# Patient Record
Sex: Female | Born: 1970 | Race: White | Hispanic: No | Smoking: Never smoker
Health system: Southern US, Community
[De-identification: ages and names within clinical notes are randomized; demographics above are authoritative.]

## PROBLEM LIST (undated history)

## (undated) DIAGNOSIS — G43829 Menstrual migraine, not intractable, without status migrainosus: Secondary | ICD-10-CM

## (undated) DIAGNOSIS — T7840XA Allergy, unspecified, initial encounter: Secondary | ICD-10-CM

## (undated) DIAGNOSIS — M1990 Unspecified osteoarthritis, unspecified site: Secondary | ICD-10-CM

## (undated) DIAGNOSIS — I10 Essential (primary) hypertension: Secondary | ICD-10-CM

## (undated) HISTORY — DX: Menstrual migraine, not intractable, without status migrainosus: G43.829

## (undated) HISTORY — PX: MOUTH SURGERY: SHX715

## (undated) HISTORY — DX: Essential (primary) hypertension: I10

## (undated) HISTORY — DX: Allergy, unspecified, initial encounter: T78.40XA

## (undated) HISTORY — DX: Unspecified osteoarthritis, unspecified site: M19.90

## (undated) HISTORY — PX: WISDOM TOOTH EXTRACTION: SHX21

---

## 2004-11-19 ENCOUNTER — Emergency Department: Payer: Self-pay | Admitting: Emergency Medicine

## 2007-08-03 ENCOUNTER — Ambulatory Visit: Payer: Self-pay

## 2007-08-06 ENCOUNTER — Ambulatory Visit: Payer: Self-pay

## 2008-03-01 ENCOUNTER — Ambulatory Visit: Payer: Self-pay | Admitting: Surgery

## 2008-09-07 ENCOUNTER — Ambulatory Visit: Payer: Self-pay

## 2010-09-13 ENCOUNTER — Ambulatory Visit: Payer: Self-pay

## 2011-09-15 ENCOUNTER — Ambulatory Visit: Payer: Self-pay

## 2012-09-22 ENCOUNTER — Ambulatory Visit: Payer: Self-pay

## 2013-11-01 ENCOUNTER — Ambulatory Visit: Payer: Self-pay

## 2014-09-07 LAB — HM PAP SMEAR: HM PAP: NEGATIVE

## 2014-09-07 LAB — HM MAMMOGRAPHY

## 2014-09-25 ENCOUNTER — Other Ambulatory Visit: Payer: Self-pay | Admitting: Obstetrics and Gynecology

## 2014-09-25 DIAGNOSIS — Z1231 Encounter for screening mammogram for malignant neoplasm of breast: Secondary | ICD-10-CM

## 2014-11-03 ENCOUNTER — Ambulatory Visit
Admission: RE | Admit: 2014-11-03 | Discharge: 2014-11-03 | Disposition: A | Discharge status: Home / Self Care | Payer: 59 | Source: Ambulatory Visit | Attending: Obstetrics and Gynecology | Admitting: Obstetrics and Gynecology

## 2014-11-03 DIAGNOSIS — Z1231 Encounter for screening mammogram for malignant neoplasm of breast: Secondary | ICD-10-CM | POA: Diagnosis not present

## 2015-10-17 ENCOUNTER — Other Ambulatory Visit: Payer: Self-pay | Admitting: Obstetrics and Gynecology

## 2015-10-17 DIAGNOSIS — Z1231 Encounter for screening mammogram for malignant neoplasm of breast: Secondary | ICD-10-CM

## 2015-11-06 ENCOUNTER — Ambulatory Visit: Payer: 59

## 2015-11-14 ENCOUNTER — Ambulatory Visit
Admission: RE | Admit: 2015-11-14 | Discharge: 2015-11-14 | Disposition: A | Discharge status: Home / Self Care | Payer: 59 | Source: Ambulatory Visit | Attending: Obstetrics and Gynecology | Admitting: Obstetrics and Gynecology

## 2015-11-14 ENCOUNTER — Other Ambulatory Visit: Payer: Self-pay | Admitting: Obstetrics and Gynecology

## 2015-11-14 DIAGNOSIS — Z1231 Encounter for screening mammogram for malignant neoplasm of breast: Secondary | ICD-10-CM | POA: Insufficient documentation

## 2016-07-28 ENCOUNTER — Telehealth: Payer: Self-pay | Admitting: Obstetrics and Gynecology

## 2016-07-28 NOTE — Telephone Encounter (Signed)
Pt would like to be notified when Prescription is sent. [phone]

## 2016-07-28 NOTE — Telephone Encounter (Signed)
Pt is calling to schedule annual and needs refill on her Birthcontrol sent to [facility]. Pt is schedule 09/11/16 with Helmut MusterAlicia.

## 2016-07-29 ENCOUNTER — Other Ambulatory Visit: Payer: Self-pay

## 2016-07-29 DIAGNOSIS — Z308 Encounter for other contraceptive management: Secondary | ICD-10-CM | POA: Insufficient documentation

## 2016-07-29 MED ORDER — NORETHINDRONE-ETH ESTRADIOL 1-35 MG-MCG PO TABS: 1.0000 | ORAL_TABLET | Freq: Every day | ORAL | 1 refills | Status: DC

## 2016-07-29 NOTE — Telephone Encounter (Signed)
Called to let pt know that refill has been sent in.  UTR pt as vm not set up.

## 2016-09-11 ENCOUNTER — Encounter: Payer: Self-pay | Admitting: Obstetrics and Gynecology

## 2016-09-11 ENCOUNTER — Ambulatory Visit (INDEPENDENT_AMBULATORY_CARE_PROVIDER_SITE_OTHER): Payer: 59 | Admitting: Obstetrics and Gynecology

## 2016-09-11 VITALS — BP 118/80 | Ht 61.0 in | Wt 209.0 lb

## 2016-09-11 DIAGNOSIS — Z01419 Encounter for gynecological examination (general) (routine) without abnormal findings: Secondary | ICD-10-CM

## 2016-09-11 DIAGNOSIS — Z124 Encounter for screening for malignant neoplasm of cervix: Secondary | ICD-10-CM | POA: Diagnosis not present

## 2016-09-11 DIAGNOSIS — Z3041 Encounter for surveillance of contraceptive pills: Secondary | ICD-10-CM | POA: Diagnosis not present

## 2016-09-11 DIAGNOSIS — Z1239 Encounter for other screening for malignant neoplasm of breast: Secondary | ICD-10-CM

## 2016-09-11 DIAGNOSIS — Z1231 Encounter for screening mammogram for malignant neoplasm of breast: Secondary | ICD-10-CM

## 2016-09-11 MED ORDER — LEVONORGESTREL-ETHINYL ESTRAD 0.15-30 MG-MCG PO TABS: 1.0000 | ORAL_TABLET | Freq: Every day | ORAL | 3 refills | Status: DC

## 2016-09-11 NOTE — Progress Notes (Signed)
Chief Complaint  Patient presents with  . Gynecologic Exam     HPI:      [name] is a 46 y.o. G1P1001 who LMP was Patient's last menstrual period was 08/20/2016., presents today for her annual examination.  Her menses are regular every 28-30 days, lasting 4 days.  Dysmenorrhea none. She does not have intermenstrual bleeding.  Sex activity: not sexually active.  Last Pap: September 07, 2014  Results were: no abnormalities /neg HPV DNA. Plain pap neg 09/10/15. Pt likes yearly paps.  Hx of STDs: none  Last mammogram: November 14, 2015  Results were: normal--routine follow-up in 12 months There is a FH of breast cancer in her MGM and mat grt aunt, genetic testing not indicated. There is no FH of ovarian cancer. The patient does do self-breast exams.  Tobacco use: The patient denies current or previous tobacco use. Alcohol use: social drinker Exercise: moderately active  She does get adequate calcium and Vitamin D in her diet.    Past Medical History:  Diagnosis Date  . Menstrual migraine     Past Surgical History:  Procedure Laterality Date  . MOUTH SURGERY      Family History  Problem Relation Age of Onset  . Breast cancer Maternal Grandmother 60  . Colon cancer Maternal Grandmother 27  . Liver cancer Maternal Grandmother   . Hypertension Mother   . Diabetes Mother   . Hypertension Father   . Diabetes Father   . Heart disease Father     Social History   Social History  . Marital status: Divorced    Spouse name: N/A  . Number of children: N/A  . Years of education: N/A   Occupational History  . Not on file.   Social History Main Topics  . Smoking status: Never Smoker  . Smokeless tobacco: Never Used  . Alcohol use No  . Drug use: No  . Sexual activity: No   Other Topics Concern  . Not on file   Social History Narrative  . No narrative on file     Current Outpatient Prescriptions:  .  levonorgestrel-ethinyl estradiol (NORDETTE) 0.15-30 MG-MCG  tablet, Take 1 tablet by mouth daily., Disp: 3 Package, Rfl: 3  ROS:  Review of Systems  Constitutional: Negative for fever, malaise/fatigue and weight loss.  HENT: Negative for congestion, ear pain and sinus pain.   Respiratory: Negative for cough, shortness of breath and wheezing.   Cardiovascular: Negative for chest pain, orthopnea and leg swelling.  Gastrointestinal: Negative for constipation, diarrhea, nausea and vomiting.  Genitourinary: Negative for dysuria, frequency, hematuria and urgency.       Breast ROS: negative   Musculoskeletal: Negative for back pain, joint pain and myalgias.  Skin: Negative for itching and rash.  Neurological: Negative for dizziness, tingling, focal weakness and headaches.  Endo/Heme/Allergies: Positive for environmental allergies. Does not bruise/bleed easily.  Psychiatric/Behavioral: Negative for depression and suicidal ideas. The patient is not nervous/anxious and does not have insomnia.     Objective: BP 118/80   Ht 5\' 1"  (1.549 m)   Wt 209 lb (94.8 kg)   LMP 08/20/2016   BMI 39.49 kg/m    Physical Exam  Constitutional: She is oriented to person, place, and time. She appears well-developed and well-nourished.  Genitourinary: Vagina normal and uterus normal. There is no rash or tenderness on the right labia. There is no rash or tenderness on the left labia. No erythema or tenderness in the vagina. No vaginal  discharge found. Right adnexum does not display mass and does not display tenderness. Left adnexum does not display mass and does not display tenderness. Cervix does not exhibit motion tenderness or polyp. Uterus is not enlarged or tender.  Neck: Normal range of motion. No thyromegaly present.  Cardiovascular: Normal rate, regular rhythm and normal heart sounds.   No murmur heard. Pulmonary/Chest: Effort normal and breath sounds normal. Right breast exhibits no mass, no nipple discharge, no skin change and no tenderness. Left breast exhibits  no mass, no nipple discharge, no skin change and no tenderness.  Abdominal: Soft. There is no tenderness. There is no guarding.  Musculoskeletal: Normal range of motion.  Neurological: She is alert and oriented to person, place, and time. No cranial nerve deficit.  Psychiatric: She has a normal mood and affect. Her behavior is normal.  Vitals reviewed.   Assessment/Plan: Encounter for annual routine gynecological examination  Cervical cancer screening - Pt likes yearly paps. HPV DNA due 2019. - Plan: Pap IG (Image Guided)  Encounter for surveillance of contraceptive pills - OCP RF to [facility].  - Plan: levonorgestrel-ethinyl estradiol (NORDETTE) 0.15-30 MG-MCG tablet  Screening for breast cancer - Pt to call to sched. - Plan: MM DIGITAL SCREENING BILATERAL             GYN counsel mammography screening, adequate intake of calcium and vitamin D, diet and exercise     F/U  Return in about 1 year (around 09/11/2017).  Boluwatife Mutchler B. Blayne Garlick, PA-C 09/11/2016 8:41 AM

## 2016-09-13 LAB — PAP IG (IMAGE GUIDED): PAP Smear Comment: 0

## 2016-09-15 ENCOUNTER — Telehealth: Payer: Self-pay

## 2016-09-16 NOTE — Telephone Encounter (Signed)
Pt needed us to contact [facility] to verify provider information. Called and gave superving NPI for ABC. Pt aware and appreciative.

## 2016-11-20 ENCOUNTER — Ambulatory Visit
Admission: RE | Admit: 2016-11-20 | Discharge: 2016-11-20 | Disposition: A | Discharge status: Home / Self Care | Payer: 59 | Source: Ambulatory Visit | Attending: Obstetrics and Gynecology | Admitting: Obstetrics and Gynecology

## 2016-11-20 DIAGNOSIS — Z1231 Encounter for screening mammogram for malignant neoplasm of breast: Secondary | ICD-10-CM | POA: Diagnosis not present

## 2016-11-20 DIAGNOSIS — Z1239 Encounter for other screening for malignant neoplasm of breast: Secondary | ICD-10-CM

## 2016-11-21 ENCOUNTER — Encounter: Payer: Self-pay | Admitting: Obstetrics and Gynecology

## 2017-07-17 ENCOUNTER — Other Ambulatory Visit: Payer: Self-pay | Admitting: Obstetrics and Gynecology

## 2017-07-17 DIAGNOSIS — Z3041 Encounter for surveillance of contraceptive pills: Secondary | ICD-10-CM

## 2017-07-20 ENCOUNTER — Other Ambulatory Visit: Payer: Self-pay | Admitting: Obstetrics and Gynecology

## 2017-07-20 ENCOUNTER — Encounter: Payer: Self-pay | Admitting: Obstetrics and Gynecology

## 2017-07-20 DIAGNOSIS — Z3041 Encounter for surveillance of contraceptive pills: Secondary | ICD-10-CM

## 2017-07-20 MED ORDER — LEVONORGESTREL-ETHINYL ESTRAD 0.15-30 MG-MCG PO TABS: 1.0000 | ORAL_TABLET | Freq: Every day | ORAL | 0 refills | Status: DC

## 2017-07-20 NOTE — Progress Notes (Signed)
Rx RF till annual 

## 2017-09-23 ENCOUNTER — Other Ambulatory Visit: Payer: Self-pay | Admitting: Obstetrics and Gynecology

## 2017-09-23 DIAGNOSIS — Z3041 Encounter for surveillance of contraceptive pills: Secondary | ICD-10-CM

## 2017-09-28 ENCOUNTER — Other Ambulatory Visit: Payer: Self-pay

## 2017-09-28 ENCOUNTER — Encounter: Payer: Self-pay | Admitting: Obstetrics and Gynecology

## 2017-09-28 ENCOUNTER — Ambulatory Visit (INDEPENDENT_AMBULATORY_CARE_PROVIDER_SITE_OTHER): Payer: Managed Care, Other (non HMO) | Admitting: Obstetrics and Gynecology

## 2017-09-28 VITALS — BP 140/90 | HR 81 | Ht 61.5 in | Wt 214.0 lb

## 2017-09-28 DIAGNOSIS — R05 Cough: Secondary | ICD-10-CM

## 2017-09-28 DIAGNOSIS — Z1239 Encounter for other screening for malignant neoplasm of breast: Secondary | ICD-10-CM

## 2017-09-28 DIAGNOSIS — R030 Elevated blood-pressure reading, without diagnosis of hypertension: Secondary | ICD-10-CM

## 2017-09-28 DIAGNOSIS — Z01419 Encounter for gynecological examination (general) (routine) without abnormal findings: Secondary | ICD-10-CM | POA: Diagnosis not present

## 2017-09-28 DIAGNOSIS — Z3041 Encounter for surveillance of contraceptive pills: Secondary | ICD-10-CM | POA: Diagnosis not present

## 2017-09-28 DIAGNOSIS — R059 Cough, unspecified: Secondary | ICD-10-CM

## 2017-09-28 DIAGNOSIS — Z124 Encounter for screening for malignant neoplasm of cervix: Secondary | ICD-10-CM

## 2017-09-28 DIAGNOSIS — Z1151 Encounter for screening for human papillomavirus (HPV): Secondary | ICD-10-CM | POA: Diagnosis not present

## 2017-09-28 DIAGNOSIS — Z1231 Encounter for screening mammogram for malignant neoplasm of breast: Secondary | ICD-10-CM | POA: Diagnosis not present

## 2017-09-28 MED ORDER — LEVONORGESTREL-ETHINYL ESTRAD 0.15-30 MG-MCG PO TABS: 1.0000 | ORAL_TABLET | Freq: Every day | ORAL | 2 refills | Status: DC

## 2017-09-28 NOTE — Progress Notes (Signed)
Chief Complaint  Patient presents with  . Gynecologic Exam    No complaints     HPI:      [name] is a 47 y.o. G1P1001 who LMP was Patient's last menstrual period was 09/16/2017., presents today for her annual examination. Her menses are regular every 28-30 days, lasting 3-4 days. Dysmenorrhea none. She does not have intermenstrual bleeding. Pt on OCPs for cycle control.   Sex activity: not sexually active.  Last Pap: 09/11/16  Results were: no abnormalities /neg HPV DNA 2016.  Pt likes yearly paps.  Hx of STDs: none  Last mammogram: 11/20/16  Results were: normal--routine follow-up in 12 months There is a FH of breast cancer in her MGM and mat grt aunt, genetic testing not indicated. There is no FH of ovarian cancer. The patient does do self-breast exams.  Tobacco use: The patient denies current or previous tobacco use. Alcohol use: social drinker Exercise: moderately active  She does get adequate calcium and Vitamin D in her diet.  Labs through work at [employer]  Pt has had dry cough for the past month, increased with exercise and laughing. Uses flonase. Tried claritin without relief. OTC allergy-cough meds and delsym help with sx. Sx slightly improved now. Hx of GERD recently and pt was taking OTC med.    Past Medical History:  Diagnosis Date  . Menstrual migraine     Past Surgical History:  Procedure Laterality Date  . MOUTH SURGERY      Family History  Problem Relation Age of Onset  . Breast cancer Maternal Grandmother 60  . Colon cancer Maternal Grandmother 50  . Liver cancer Maternal Grandmother   . Hypertension Mother   . Diabetes Mother   . Hypertension Father   . Diabetes Father   . Heart disease Father   . Hypertension Sister     Social History   Socioeconomic History  . Marital status: Divorced    Spouse name: Not on file  . Number of children: Not on file  . Years of education: Not on file  . Highest education level: Not on file    Occupational History  . Not on file  Social Needs  . Financial resource strain: Not on file  . Food insecurity:    Worry: Not on file    Inability: Not on file  . Transportation needs:    Medical: Not on file    Non-medical: Not on file  Tobacco Use  . Smoking status: Never Smoker  . Smokeless tobacco: Never Used  Substance and Sexual Activity  . Alcohol use: No  . Drug use: No  . Sexual activity: Never    Birth control/protection: Pill  Lifestyle  . Physical activity:    Days per week: 3 days    Minutes per session: 120 min  . Stress: Not on file  Relationships  . Social connections:    Talks on phone: Not on file    Gets together: Not on file    Attends religious service: Not on file    Active member of club or organization: Not on file    Attends meetings of clubs or organizations: Not on file    Relationship status: Not on file  . Intimate partner violence:    Fear of current or ex partner: Not on file    Emotionally abused: Not on file    Physically abused: Not on file    Forced sexual activity: Not on file  Other  Topics Concern  . Not on file  Social History Narrative  . Not on file    No current outpatient medications on file prior to visit.   No current facility-administered medications on file prior to visit.     ROS:  Review of Systems  Constitutional: Negative for fatigue, fever and unexpected weight change.  Respiratory: Positive for cough. Negative for shortness of breath and wheezing.   Cardiovascular: Negative for chest pain, palpitations and leg swelling.  Gastrointestinal: Negative for blood in stool, constipation, diarrhea, nausea and vomiting.  Endocrine: Negative for cold intolerance, heat intolerance and polyuria.  Genitourinary: Negative for dyspareunia, dysuria, flank pain, frequency, genital sores, hematuria, menstrual problem, pelvic pain, urgency, vaginal bleeding, vaginal discharge and vaginal pain.  Musculoskeletal: Negative for  back pain, joint swelling and myalgias.  Skin: Negative for rash.  Neurological: Negative for dizziness, syncope, light-headedness, numbness and headaches.  Hematological: Negative for adenopathy.  Psychiatric/Behavioral: Negative for agitation, confusion, sleep disturbance and suicidal ideas. The patient is not nervous/anxious.      Objective: BP 140/90 (BP Location: Left Arm, Patient Position: Sitting, Cuff Size: Large)   Pulse 81   Ht 5' 1.5" (1.562 m)   Wt 214 lb (97.1 kg)   LMP 09/16/2017   BMI 39.78 kg/m    Physical Exam  Constitutional: She is oriented to person, place, and time. She appears well-developed and well-nourished.  Genitourinary: Vagina normal and uterus normal. There is no rash or tenderness on the right labia. There is no rash or tenderness on the left labia. No erythema or tenderness in the vagina. No vaginal discharge found. Right adnexum does not display mass and does not display tenderness. Left adnexum does not display mass and does not display tenderness. Cervix does not exhibit motion tenderness or polyp. Uterus is not enlarged or tender.  Neck: Normal range of motion. No thyromegaly present.  Cardiovascular: Normal rate, regular rhythm and normal heart sounds.  No murmur heard. Pulmonary/Chest: Effort normal and breath sounds normal. Right breast exhibits no mass, no nipple discharge, no skin change and no tenderness. Left breast exhibits no mass, no nipple discharge, no skin change and no tenderness.  Abdominal: Soft. There is no tenderness. There is no guarding.  Musculoskeletal: Normal range of motion.  Neurological: She is alert and oriented to person, place, and time. No cranial nerve deficit.  Psychiatric: She has a normal mood and affect. Her behavior is normal.  Vitals reviewed.   Assessment/Plan: Encounter for annual routine gynecological examination  Cervical cancer screening - Plan: IGP, Aptima HPV  Screening for HPV (human papillomavirus)  - Plan: IGP, Aptima HPV  Screening for breast cancer - pt to sched mammo. - Plan: MM DIGITAL SCREENING BILATERAL  Encounter for surveillance of contraceptive pills - OCP RF to [facility]. Already had #84 tabs sent 09/24/17. - Plan: levonorgestrel-ethinyl estradiol (PORTIA-28) 0.15-30 MG-MCG tablet  Elevated blood-pressure reading without diagnosis of hypertension - BP check [facility] at [facility].  Cough - For 1 mo, sx improving. Add zyrtec to flonase. F/u with PCP if sx persist given hx of GERD.  Meds ordered this encounter  Medications  . levonorgestrel-ethinyl estradiol (PORTIA-28) 0.15-30 MG-MCG tablet    Sig: Take 1 tablet by mouth daily.    Dispense:  84 tablet    Refill:  2    Order Specific Question:   Supervising Provider    Answer:   Nadara Mustard [[id number]]             GYN counsel  breast self exam, mammography screening, adequate intake of calcium and vitamin D, diet and exercise     F/U  Return in about 1 year (around 09/29/2018).  Alicia B. Copland, PA-C 09/28/2017 10:50 AM

## 2017-09-28 NOTE — Patient Instructions (Addendum)
I value your feedback and entrusting us with your care. If you get a [facility] patient survey, I would appreciate you taking the time to let us know about your experience today. Thank you!  [facility]: [phone]    

## 2017-10-01 LAB — IGP, APTIMA HPV
HPV APTIMA: NEGATIVE
PAP SMEAR COMMENT: 0

## 2017-11-26 ENCOUNTER — Ambulatory Visit
Admission: RE | Admit: 2017-11-26 | Discharge: 2017-11-26 | Disposition: A | Discharge status: Home / Self Care | Payer: Managed Care, Other (non HMO) | Source: Ambulatory Visit | Attending: Obstetrics and Gynecology | Admitting: Obstetrics and Gynecology

## 2017-11-26 DIAGNOSIS — Z1231 Encounter for screening mammogram for malignant neoplasm of breast: Secondary | ICD-10-CM | POA: Diagnosis not present

## 2017-11-26 DIAGNOSIS — Z1239 Encounter for other screening for malignant neoplasm of breast: Secondary | ICD-10-CM

## 2017-11-30 ENCOUNTER — Encounter: Payer: Self-pay | Admitting: Obstetrics and Gynecology

## 2017-12-22 ENCOUNTER — Ambulatory Visit (INDEPENDENT_AMBULATORY_CARE_PROVIDER_SITE_OTHER): Payer: Managed Care, Other (non HMO)

## 2017-12-22 ENCOUNTER — Ambulatory Visit: Payer: Managed Care, Other (non HMO) | Admitting: Podiatry

## 2017-12-22 ENCOUNTER — Encounter: Payer: Self-pay | Admitting: Podiatry

## 2017-12-22 VITALS — BP 161/92 | HR 88

## 2017-12-22 DIAGNOSIS — M722 Plantar fascial fibromatosis: Secondary | ICD-10-CM

## 2017-12-22 DIAGNOSIS — E669 Obesity, unspecified: Secondary | ICD-10-CM | POA: Insufficient documentation

## 2017-12-22 MED ORDER — METHYLPREDNISOLONE 4 MG PO TBPK: ORAL_TABLET | ORAL | 0 refills | Status: DC

## 2017-12-22 MED ORDER — MELOXICAM 15 MG PO TABS: 15.0000 mg | ORAL_TABLET | Freq: Every day | ORAL | 1 refills | Status: AC

## 2017-12-24 NOTE — Progress Notes (Signed)
   Subjective: 47 year old female presenting today as a new patient with a chief complaint of pain to the plantar aspect of the right heel that began 3-4 years ago. She states she was treated by a chiropractor with laser treatments 5 years ago which helped alleviate the pain for a while but it has since returned. Walking and standing for long periods of time increases the pain. She has not done anything for treatment. Patient is here for further evaluation and treatment.   Past Medical History:  Diagnosis Date  . Menstrual migraine      Objective: Physical Exam General: The patient is alert and oriented x3 in no acute distress.  Dermatology: Skin is warm, dry and supple bilateral lower extremities. Negative for open lesions or macerations bilateral.   Vascular: Dorsalis Pedis and Posterior Tibial pulses palpable bilateral.  Capillary fill time is immediate to all digits.  Neurological: Epicritic and protective threshold intact bilateral.   Musculoskeletal: Tenderness to palpation to the plantar aspect of the right heel along the plantar fascia. All other joints range of motion within normal limits bilateral. Strength 5/5 in all groups bilateral.   Radiographic exam: Normal osseous mineralization. Joint spaces preserved. No fracture/dislocation/boney destruction. No other soft tissue abnormalities or radiopaque foreign bodies.   Assessment: 1. Plantar fasciitis right 2. Pain in right foot  Plan of Care:  1. Patient evaluated. Xrays reviewed.   2. Declined injections.  3. Rx for Medrol Dose pack placed 4. Rx for Meloxicam ordered for patient. 4. Plantar fascial band(s) dispensed 5. Instructed patient regarding therapies and modalities at home to alleviate symptoms.  6. Continue treatment with chiropractor if she feels like it is helping.  7. Return to clinic in 4 weeks.     Felecia ShellingBrent M. Rashaan Wyles, DPM [facility]  Dr. Felecia ShellingBrent M. Starlin Steib, DPM    [address]                                         [place], [place] [address]                Office [phone]  Fax [phone]

## 2018-01-19 ENCOUNTER — Ambulatory Visit: Payer: Managed Care, Other (non HMO) | Admitting: Podiatry

## 2018-02-05 ENCOUNTER — Other Ambulatory Visit: Payer: Self-pay

## 2018-02-05 ENCOUNTER — Ambulatory Visit (INDEPENDENT_AMBULATORY_CARE_PROVIDER_SITE_OTHER): Payer: Managed Care, Other (non HMO) | Admitting: Nurse Practitioner

## 2018-02-05 ENCOUNTER — Encounter: Payer: Self-pay | Admitting: Nurse Practitioner

## 2018-02-05 VITALS — BP 166/81 | HR 102 | Temp 98.4°F | Ht 61.5 in | Wt 224.8 lb

## 2018-02-05 DIAGNOSIS — R635 Abnormal weight gain: Secondary | ICD-10-CM | POA: Diagnosis not present

## 2018-02-05 DIAGNOSIS — E782 Mixed hyperlipidemia: Secondary | ICD-10-CM

## 2018-02-05 DIAGNOSIS — I10 Essential (primary) hypertension: Secondary | ICD-10-CM | POA: Diagnosis not present

## 2018-02-05 DIAGNOSIS — Z7689 Persons encountering health services in other specified circumstances: Secondary | ICD-10-CM

## 2018-02-05 MED ORDER — LISINOPRIL 10 MG PO TABS: 10.0000 mg | ORAL_TABLET | Freq: Every day | ORAL | 1 refills | Status: DC

## 2018-02-05 NOTE — Patient Instructions (Addendum)
Tina Hurley,   Thank you for coming in to clinic today.  1. START lisinopril 10 mg once daily.   Some of the possible side effects are:  - angioedema: swelling of lips, mouth, and tongue.  If this rare side effect occurs, please go to ED. - cough: you could develop a dry, hacking cough caused by this medicine.  If it occurs, it will go away after stopping this medicine.  Call the clinic before stopping the medication. - kidney damage: we will monitor your labs when we start this medicine and at least one time per year.  If you do not have an change in kidney function when starting this medicine, it will provide kidney protection over time.  2. Keep a food log for about 14 days, then re-evaluate where you can make changes. - Increase vegetables.  Please schedule a follow-up appointment with Wilhelmina Mcardle, AGNP. Return in about 6 weeks (around 03/19/2018) for hypertension.  If you have any other questions or concerns, please feel free to call the clinic or send a message through MyChart. You may also schedule an earlier appointment if necessary.  You will receive a survey after today's visit either digitally by e-mail or paper by [employer]. Your experiences and feedback matter to [place].  Please respond so we know how we are doing as we provide care for you.   Wilhelmina Mcardle, DNP, AGNP-BC Adult Gerontology Nurse Practitioner [facility], [facility]

## 2018-02-05 NOTE — Progress Notes (Signed)
Subjective:    Patient ID: Tina Hurley, female    DOB: 03/10/1971, 47 y.o.   MRN: [id number]  Tina Hurley is a 47 y.o. female presenting on 02/05/2018 for Establish Care (elevated bp, weight gain )     HPI Establish Care New Provider Pt last seen by PCP many years ago.  Patient returns to primary care today because of abnormal biometric employer screening.  Biometric form reveals abnormalities for the following: Elevated BP Increased Cholesterol Obesity with Weight Gain  BP and cholesterol: Patient has been asymptomatic.  Notes some dietary indiscretions, but is also concerned there may be some other causes for weight gain.  Insomnia [employer] Night time sleep aid 25 mg on weeknights. Patient has difficulty with sleep onset.  Past Medical History:  Diagnosis Date  . Allergy   . Menstrual migraine    Past Surgical History:  Procedure Laterality Date  . MOUTH SURGERY    . WISDOM TOOTH EXTRACTION     Social History   Socioeconomic History  . Marital status: Divorced    Spouse name: Not on file  . Number of children: Not on file  . Years of education: Not on file  . Highest education level: Some college, no degree  Occupational History  . Not on file  Social Needs  . Financial resource strain: Not hard at all  . Food insecurity:    Worry: Never true    Inability: Never true  . Transportation needs:    Medical: No    Non-medical: No  Tobacco Use  . Smoking status: Never Smoker  . Smokeless tobacco: Never Used  Substance and Sexual Activity  . Alcohol use: No  . Drug use: No  . Sexual activity: Never    Birth control/protection: Pill  Lifestyle  . Physical activity:    Days per week: 3 days    Minutes per session: 120 min  . Stress: Only a little  Relationships  . Social connections:    Talks on phone: Twice a week    Gets together: Twice a week    Attends religious service: Never    Active member of club or organization: No    Attends  meetings of clubs or organizations: Never    Relationship status: Divorced  . Intimate partner violence:    Fear of current or ex partner: No    Emotionally abused: No    Physically abused: No    Forced sexual activity: No  Other Topics Concern  . Not on file  Social History Narrative  . Not on file   Family History  Problem Relation Age of Onset  . Breast cancer Maternal Grandmother 60  . Colon cancer Maternal Grandmother 6  . Liver cancer Maternal Grandmother   . Hypertension Mother   . Diabetes Mother   . Hypertension Father   . Diabetes Father   . Heart disease Father   . Heart attack Father   . Obesity Father   . Hypertension Sister   . Dementia Maternal Grandfather   . Heart attack Paternal Grandfather    Current Outpatient Medications on File Prior to Visit  Medication Sig  . b complex vitamins tablet Take 1 tablet by mouth daily.  Marland Kitchen levonorgestrel-ethinyl estradiol (PORTIA-28) 0.15-30 MG-MCG tablet Take 1 tablet by mouth daily.  . Multiple Vitamin (MULTIVITAMIN) tablet Take 1 tablet by mouth daily.   No current facility-administered medications on file prior to visit.     Review of Systems Per  HPI unless specifically indicated above      Objective:    BP (!) 166/81 (BP Location: Left Arm, Patient Position: Sitting, Cuff Size: Normal)   Pulse (!) 102   Temp 98.4 F (36.9 C) (Oral)   Ht 5' 1.5" (1.562 m)   Wt 224 lb 12.8 oz (102 kg)   BMI 41.79 kg/m   Wt Readings from Last 3 Encounters:  03/19/18 219 lb (99.3 kg)  02/05/18 224 lb 12.8 oz (102 kg)  09/28/17 214 lb (97.1 kg)    Physical Exam  Constitutional: She is oriented to person, place, and time. She appears well-developed and well-nourished. No distress.  HENT:  Head: Normocephalic and atraumatic.  Neurological: She is alert and oriented to person, place, and time.  Skin: Skin is warm and dry. Capillary refill takes less than 2 seconds.  Psychiatric: She has a normal mood and affect. Her  behavior is normal.  Vitals reviewed.   Results for orders placed or performed in visit on 02/05/18  TSH  Result Value Ref Range   TSH 2.470 0.450 - 4.500 uIU/mL  Lipid panel  Result Value Ref Range   Cholesterol, Total 171 100 - 199 mg/dL   Triglycerides 272 (H) 0 - 149 mg/dL   HDL 33 (L) >53 mg/dL   VLDL Cholesterol Cal 61 (H) 5 - 40 mg/dL   LDL Calculated 77 0 - 99 mg/dL   Chol/HDL Ratio 5.2 (H) 0.0 - 4.4 ratio  Comprehensive metabolic panel  Result Value Ref Range   Glucose 83 65 - 99 mg/dL   BUN 9 6 - 24 mg/dL   Creatinine, Ser 6.64 0.57 - 1.00 mg/dL   GFR calc non Af Amer 104 >59 mL/min/1.73   GFR calc Af Amer 120 >59 mL/min/1.73   BUN/Creatinine Ratio 13 9 - 23   Sodium 137 134 - 144 mmol/L   Potassium 4.0 3.5 - 5.2 mmol/L   Chloride 103 96 - 106 mmol/L   CO2 21 20 - 29 mmol/L   Calcium 9.4 8.7 - 10.2 mg/dL   Total Protein 6.6 6.0 - 8.5 g/dL   Albumin 4.0 3.5 - 5.5 g/dL   Globulin, Total 2.6 1.5 - 4.5 g/dL   Albumin/Globulin Ratio 1.5 1.2 - 2.2   Bilirubin Total 0.4 0.0 - 1.2 mg/dL   Alkaline Phosphatase 64 39 - 117 IU/L   AST 20 0 - 40 IU/L   ALT 20 0 - 32 IU/L      Assessment & Plan:   Problem List Items Addressed This Visit    None    Visit Diagnoses    Essential hypertension    -  Primary Uncontrolled hypertension.  BP goal < 130/80.  Pt is not working on lifestyle modifications.  Taking medications tolerating well without side effects.  Plan: 1. START lisinopril 10 mg once daily.  Discussed possible side effects of angioedema (rare), cough (common and reversible), kidney damage (rare, increase monitoring with start). 2. Obtain labs  3. Encouraged heart healthy diet and increasing exercise to 30 minutes most days of the week. 4. Check BP 1-2 x per week at home, keep log, and bring to clinic at next appointment. 5. Follow up 6 weeks.     Relevant Orders   TSH (Completed)   Lipid panel (Completed)   Comprehensive metabolic panel (Completed)    Encounter to establish care     Previous PCP was many years ago.  Records will not be requested.  Past medical, family, and surgical  history reviewed w/ patient in clinic.     Weight gain     Possible association to thyroid disorder.  Otherwise, is likely calorie overnutrition.  Labs today.  FOLLOW-UP 6 weeks.   Relevant Orders   TSH (Completed)   Mixed hyperlipidemia Stable without signs and symptoms of ASCVD events.  Repeat lipid panel.  Consider starting statin therapy if needed.  Diet changes recommended for weight loss.  See AVS.  Follow-up 6 weeks prn.       Relevant Orders   Lipid panel (Completed)   Comprehensive metabolic panel (Completed)      Meds ordered this encounter  Medications  . lisinopril (PRINIVIL,ZESTRIL) 10 MG tablet    Sig: Take 1 tablet (10 mg total) by mouth daily.    Dispense:  90 tablet    Refill:  1    Order Specific Question:   Supervising Provider    Answer:   Smitty Cords [[id number]]    Follow up plan: Return in about 6 weeks (around 03/19/2018) for hypertension.  Wilhelmina Mcardle, DNP, AGPCNP-BC Adult Gerontology Primary Care Nurse Practitioner [facility] 02/05/2018, 1:57 PM

## 2018-02-26 LAB — COMPREHENSIVE METABOLIC PANEL
ALT: 20 IU/L (ref 0–32)
AST: 20 IU/L (ref 0–40)
Albumin/Globulin Ratio: 1.5 (ref 1.2–2.2)
Albumin: 4 g/dL (ref 3.5–5.5)
Alkaline Phosphatase: 64 IU/L (ref 39–117)
BUN/Creatinine Ratio: 13 (ref 9–23)
BUN: 9 mg/dL (ref 6–24)
Bilirubin Total: 0.4 mg/dL (ref 0.0–1.2)
CO2: 21 mmol/L (ref 20–29)
Calcium: 9.4 mg/dL (ref 8.7–10.2)
Chloride: 103 mmol/L (ref 96–106)
Creatinine, Ser: 0.69 mg/dL (ref 0.57–1.00)
GFR calc Af Amer: 120 mL/min/{1.73_m2} (ref >59)
GFR calc non Af Amer: 104 mL/min/{1.73_m2} (ref >59)
Globulin, Total: 2.6 g/dL (ref 1.5–4.5)
Glucose: 83 mg/dL (ref 65–99)
Potassium: 4 mmol/L (ref 3.5–5.2)
Sodium: 137 mmol/L (ref 134–144)
Total Protein: 6.6 g/dL (ref 6.0–8.5)

## 2018-02-26 LAB — LIPID PANEL
Chol/HDL Ratio: 5.2 ratio — ABNORMAL HIGH (ref 0.0–4.4)
Cholesterol, Total: 171 mg/dL (ref 100–199)
HDL: 33 mg/dL — ABNORMAL LOW (ref >39)
LDL Calculated: 77 mg/dL (ref 0–99)
Triglycerides: 306 mg/dL — ABNORMAL HIGH (ref 0–149)
VLDL Cholesterol Cal: 61 mg/dL — ABNORMAL HIGH (ref 5–40)

## 2018-02-26 LAB — TSH: TSH: 2.47 u[IU]/mL (ref 0.450–4.500)

## 2018-03-11 ENCOUNTER — Encounter: Payer: Self-pay | Admitting: Obstetrics and Gynecology

## 2018-03-15 NOTE — Telephone Encounter (Signed)
This pt is getting a bill from [facility] for trichomonas testing done on routine STD testing with pap smear (applied to deductible). They aren't covering trich. Is there a way to contact [facility] to see if we can add additional codes for coverage?

## 2018-03-19 ENCOUNTER — Encounter: Payer: Self-pay | Admitting: Nurse Practitioner

## 2018-03-19 ENCOUNTER — Ambulatory Visit: Payer: Managed Care, Other (non HMO) | Admitting: Nurse Practitioner

## 2018-03-19 VITALS — BP 126/67 | HR 83 | Temp 98.2°F | Ht 62.0 in | Wt 219.0 lb

## 2018-03-19 DIAGNOSIS — I10 Essential (primary) hypertension: Secondary | ICD-10-CM

## 2018-03-19 DIAGNOSIS — E782 Mixed hyperlipidemia: Secondary | ICD-10-CM | POA: Diagnosis not present

## 2018-03-19 MED ORDER — LISINOPRIL 10 MG PO TABS: 10.0000 mg | ORAL_TABLET | Freq: Every day | ORAL | 1 refills | Status: DC

## 2018-03-19 NOTE — Progress Notes (Signed)
Subjective:    Patient ID: Tina Hurley, female    DOB: 06/18/1970, 47 y.o.   MRN: [id number]  Tina Hurley is a 47 y.o. female presenting on 03/19/2018 for BP Follow Up   HPI Hypertension - She is checking BP at home or outside of clinic.    - Current medications: lisinopril 10 mg once daily, tolerating well without side effects - She is not currently symptomatic. - Pt denies headache, lightheadedness, dizziness, changes in vision, chest tightness/pressure, palpitations, leg swelling, sudden loss of speech or loss of consciousness. - She  reports no regular exercise routine. - Her diet is moderate in salt, moderate in fat, and moderate in carbohydrates.  - Has done food log x 2 weeks and has made enough changes for 5 lbs weight loss.  Social History   Tobacco Use  . Smoking status: Never Smoker  . Smokeless tobacco: Never Used  Substance Use Topics  . Alcohol use: No  . Drug use: No    Review of Systems Per HPI unless specifically indicated above     Objective:    BP 126/67 (BP Location: Right Arm, Patient Position: Sitting, Cuff Size: Large)   Pulse 83   Temp 98.2 F (36.8 C)   Ht 5\' 2"  (1.575 m)   Wt 219 lb (99.3 kg)   SpO2 98%   BMI 40.06 kg/m   Wt Readings from Last 3 Encounters:  03/19/18 219 lb (99.3 kg)  02/05/18 224 lb 12.8 oz (102 kg)  09/28/17 214 lb (97.1 kg)    Physical Exam  Constitutional: She is oriented to person, place, and time. She appears well-developed and well-nourished. No distress.  HENT:  Head: Normocephalic and atraumatic.  Neck: Normal range of motion. Neck supple. Carotid bruit is not present.  Cardiovascular: Normal rate, regular rhythm, S1 normal, S2 normal, normal heart sounds and intact distal pulses.  Pulmonary/Chest: Effort normal and breath sounds normal. No respiratory distress.  Musculoskeletal: She exhibits no edema (pedal).  Neurological: She is alert and oriented to person, place, and time.  Skin: Skin is warm  and dry. Capillary refill takes less than 2 seconds.  Psychiatric: She has a normal mood and affect. Her behavior is normal. Judgment and thought content normal.  Vitals reviewed.  Results for orders placed or performed in visit on 02/05/18  TSH  Result Value Ref Range   TSH 2.470 0.450 - 4.500 uIU/mL  Lipid panel  Result Value Ref Range   Cholesterol, Total 171 100 - 199 mg/dL   Triglycerides 272 (H) 0 - 149 mg/dL   HDL 33 (L) >53 mg/dL   VLDL Cholesterol Cal 61 (H) 5 - 40 mg/dL   LDL Calculated 77 0 - 99 mg/dL   Chol/HDL Ratio 5.2 (H) 0.0 - 4.4 ratio  Comprehensive metabolic panel  Result Value Ref Range   Glucose 83 65 - 99 mg/dL   BUN 9 6 - 24 mg/dL   Creatinine, Ser 6.64 0.57 - 1.00 mg/dL   GFR calc non Af Amer 104 >59 mL/min/1.73   GFR calc Af Amer 120 >59 mL/min/1.73   BUN/Creatinine Ratio 13 9 - 23   Sodium 137 134 - 144 mmol/L   Potassium 4.0 3.5 - 5.2 mmol/L   Chloride 103 96 - 106 mmol/L   CO2 21 20 - 29 mmol/L   Calcium 9.4 8.7 - 10.2 mg/dL   Total Protein 6.6 6.0 - 8.5 g/dL   Albumin 4.0 3.5 - 5.5 g/dL   Globulin,  Total 2.6 1.5 - 4.5 g/dL   Albumin/Globulin Ratio 1.5 1.2 - 2.2   Bilirubin Total 0.4 0.0 - 1.2 mg/dL   Alkaline Phosphatase 64 39 - 117 IU/L   AST 20 0 - 40 IU/L   ALT 20 0 - 32 IU/L      Assessment & Plan:   Problem List Items Addressed This Visit    None    Visit Diagnoses    Essential hypertension    -  Primary Controlled hypertension.  BP goal < 130/80.  Pt is working on lifestyle modifications.  Taking medications tolerating well without side effects. No current complications.  Plan: 1. Continue taking lisinopril 10 mg once daily 2. Obtain labs again prior to next visit.  3. Encouraged heart healthy diet and increasing exercise to 30 minutes most days of the week. 4. Check BP 1-2 x per week at home, keep log, and bring to clinic at next appointment. 5. Follow up 6 months.     Relevant Medications   lisinopril (PRINIVIL,ZESTRIL) 10 MG  tablet   Other Relevant Orders   Comprehensive metabolic panel   Mixed hyperlipidemia     Will need lipid panel recheck prior to next visit.  Orders placed today.  Focus on lifestyle changes for further triglyceride reduction.  Continue fish oil 1,000 mg bid as recommended after last labs. - Follow-up 6 months.   Relevant Medications   lisinopril (PRINIVIL,ZESTRIL) 10 MG tablet   Other Relevant Orders   Lipid panel   Comprehensive metabolic panel      Meds ordered this encounter  Medications  . lisinopril (PRINIVIL,ZESTRIL) 10 MG tablet    Sig: Take 1 tablet (10 mg total) by mouth daily.    Dispense:  90 tablet    Refill:  1    Order Specific Question:   Supervising Provider    Answer:   Smitty Cords [[id number]]    Follow up plan: Return in about 6 months (around 09/17/2018) for hypertension.  Tina Mcardle, DNP, AGPCNP-BC Adult Gerontology Primary Care Nurse Practitioner [facility] 03/19/2018, 1:25 PM

## 2018-03-19 NOTE — Patient Instructions (Addendum)
Tina Hurley,   Thank you for coming in to clinic today.  1. Continue all of your great work on lifestyle.  2. Continue lisinopril 10 mg once daily.  3. You will be due for FASTING BLOOD WORK.  This means you should eat no food or drink after midnight.  Drink only water or coffee without cream/sugar on the morning of your lab visit. - Please go ahead and schedule a "Lab Only" visit in the morning at the clinic for lab draw 3 days before your next office visit.  Call in advance for labs to be released for [facility].  Around May 4th. - Your results will be available about 2-3 days after blood draw.  If you have set up a MyChart account, you can can log in to MyChart online to view your results and a brief explanation. Also, we can discuss your results together at your next office visit if you would like.  Please schedule a follow-up appointment with Wilhelmina Mcardle, AGNP. Return in about 6 months (around 09/17/2018) for hypertension.  If you have any other questions or concerns, please feel free to call the clinic or send a message through MyChart. You may also schedule an earlier appointment if necessary.  You will receive a survey after today's visit either digitally by e-mail or paper by [employer]. Your experiences and feedback matter to [place].  Please respond so we know how we are doing as we provide care for you.   Wilhelmina Mcardle, DNP, AGNP-BC Adult Gerontology Nurse Practitioner [facility], [facility]

## 2018-04-01 ENCOUNTER — Encounter: Payer: Self-pay | Admitting: Nurse Practitioner

## 2018-09-08 ENCOUNTER — Other Ambulatory Visit: Payer: Self-pay | Admitting: Family Medicine

## 2018-09-08 DIAGNOSIS — I10 Essential (primary) hypertension: Secondary | ICD-10-CM

## 2018-09-08 DIAGNOSIS — E782 Mixed hyperlipidemia: Secondary | ICD-10-CM

## 2018-09-11 LAB — LIPID PANEL
Chol/HDL Ratio: 4.5 ratio — ABNORMAL HIGH (ref 0.0–4.4)
Cholesterol, Total: 165 mg/dL (ref 100–199)
HDL: 37 mg/dL — ABNORMAL LOW (ref >39)
LDL Calculated: 85 mg/dL (ref 0–99)
Triglycerides: 216 mg/dL — ABNORMAL HIGH (ref 0–149)
VLDL Cholesterol Cal: 43 mg/dL — ABNORMAL HIGH (ref 5–40)

## 2018-09-11 LAB — COMPREHENSIVE METABOLIC PANEL
ALT: 13 IU/L (ref 0–32)
AST: 16 IU/L (ref 0–40)
Albumin/Globulin Ratio: 1.3 (ref 1.2–2.2)
Albumin: 3.7 g/dL — ABNORMAL LOW (ref 3.8–4.8)
Alkaline Phosphatase: 62 IU/L (ref 39–117)
BUN/Creatinine Ratio: 16 (ref 9–23)
BUN: 12 mg/dL (ref 6–24)
Bilirubin Total: 0.4 mg/dL (ref 0.0–1.2)
CO2: 18 mmol/L — ABNORMAL LOW (ref 20–29)
Calcium: 9.5 mg/dL (ref 8.7–10.2)
Chloride: 103 mmol/L (ref 96–106)
Creatinine, Ser: 0.74 mg/dL (ref 0.57–1.00)
GFR calc Af Amer: 111 mL/min/{1.73_m2} (ref >59)
GFR calc non Af Amer: 96 mL/min/{1.73_m2} (ref >59)
Globulin, Total: 2.8 g/dL (ref 1.5–4.5)
Glucose: 83 mg/dL (ref 65–99)
Potassium: 4.1 mmol/L (ref 3.5–5.2)
Sodium: 136 mmol/L (ref 134–144)
Total Protein: 6.5 g/dL (ref 6.0–8.5)

## 2018-09-24 ENCOUNTER — Encounter: Payer: Self-pay | Admitting: Family Medicine

## 2018-09-24 ENCOUNTER — Ambulatory Visit (INDEPENDENT_AMBULATORY_CARE_PROVIDER_SITE_OTHER): Payer: Managed Care, Other (non HMO) | Admitting: Family Medicine

## 2018-09-24 ENCOUNTER — Other Ambulatory Visit: Payer: Self-pay

## 2018-09-24 DIAGNOSIS — E782 Mixed hyperlipidemia: Secondary | ICD-10-CM

## 2018-09-24 DIAGNOSIS — I10 Essential (primary) hypertension: Secondary | ICD-10-CM | POA: Diagnosis not present

## 2018-09-24 NOTE — Assessment & Plan Note (Signed)
Improving cholesterol mostly hypertriglyceridemia on improving lifestyle Last lipid panel 09/2018  Plan: 1. START OTC FIsh Oil Omega 3 - 1,000 mg daily - already has - not started yet 2. Encourage improved lifestyle - low carb/cholesterol, reduce portion size, continue improving  regular exercise  Can do yearly lipids at this point if preferred

## 2018-09-24 NOTE — Assessment & Plan Note (Signed)
Reportedly controlled BP still, improving lifestyle - Home BP readings none  No known complications     Plan:  1. Continue current BP regimen - Lisinopril 10mg  daily 2. Encourage improved lifestyle - low sodium diet, regular exercise 3. Future can resume monitor BP outside office, bring readings to next visit, if persistently >140/90 or new symptoms notify office sooner 4. Follow-up 6 months

## 2018-09-24 NOTE — Progress Notes (Signed)
Virtual Visit via Telephone The purpose of this virtual visit is to provide medical care while limiting exposure to the novel coronavirus (COVID19) for both patient and office staff.  Consent was obtained for phone visit:  Yes.   Answered questions that patient had about telehealth interaction:  Yes.   I discussed the limitations, risks, security and privacy concerns of performing an evaluation and management service by telephone. I also discussed with the patient that there may be a patient responsible charge related to this service. The patient expressed understanding and agreed to proceed.  Patient Location: Home Provider Location: [facility] (Office)  PCP is Wilhelmina Mcardle, AGPCNP-BC - I am currently covering during her maternity leave.   ---------------------------------------------------------------------- Chief Complaint  Patient presents with  . Hypertension    S: Reviewed CMA documentation. I have called patient and gathered additional HPI as follows:  CHRONIC HTN: Reports not checking home BP currently. Not going to pharmacy to check BP. Current Meds - Lisinopril 10mg  daily   Reports good compliance, took meds today. Tolerating well, w/o complaints. Denies CP, dyspnea, HA, edema, dizziness / lightheadedness  HYPERLIPIDEMIA: - Reports no concerns. Last lipid panel 09/2018, improved Triglycerides from 300 down to 200, normal LDL and improving HDL - Currently not on cholesterol medicine. Has Fish Oil omega 3 1,000mg  Lifestyle - Weight unchanged, hopes to try to lose weight to reduce her BP med - Diet: Now working from home, less snacking on cheese as she did at work - Exercise: working x 4 weekly at home    Patient is currently working from home Denies any high risk travel to areas of current concern for COVID19. Denies any known or suspected exposure to person with or possibly with COVID19.  Denies any fevers, chills, sweats, body ache, cough, shortness  of breath, sinus pain or pressure, headache, abdominal pain, diarrhea  Past Medical History:  Diagnosis Date  . Allergy   . Menstrual migraine    Social History   Tobacco Use  . Smoking status: Never Smoker  . Smokeless tobacco: Never Used  Substance Use Topics  . Alcohol use: Yes    Comment: occassionally   . Drug use: No    Current Outpatient Medications:  .  levonorgestrel-ethinyl estradiol (PORTIA-28) 0.15-30 MG-MCG tablet, Take 1 tablet by mouth daily., Disp: 84 tablet, Rfl: 2 .  lisinopril (PRINIVIL,ZESTRIL) 10 MG tablet, Take 1 tablet (10 mg total) by mouth daily., Disp: 90 tablet, Rfl: 1 .  loratadine (CLARITIN) 10 MG tablet, Take 10 mg by mouth daily., Disp: , Rfl:  .  Multiple Vitamin (MULTIVITAMIN) tablet, Take 1 tablet by mouth daily., Disp: , Rfl:  .  b complex vitamins tablet, Take 1 tablet by mouth daily., Disp: , Rfl:   Depression screen PHQ 2/9 02/05/2018  Decreased Interest 0  Down, Depressed, Hopeless 0  PHQ - 2 Score 0    No flowsheet data found.  -------------------------------------------------------------------------- O: No physical exam performed due to remote telephone encounter.  Lab results reviewed.  Recent Results (from the past 2160 hour(s))  Comprehensive metabolic panel     Status: Abnormal   Collection Time: 09/10/18  9:00 AM  Result Value Ref Range   Glucose 83 65 - 99 mg/dL   BUN 12 6 - 24 mg/dL   Creatinine, Ser 8.11 0.57 - 1.00 mg/dL   GFR calc non Af Amer 96 >59 mL/min/1.73   GFR calc Af Amer 111 >59 mL/min/1.73   BUN/Creatinine Ratio 16 9 - 23  Sodium 136 134 - 144 mmol/L   Potassium 4.1 3.5 - 5.2 mmol/L   Chloride 103 96 - 106 mmol/L   CO2 18 (L) 20 - 29 mmol/L   Calcium 9.5 8.7 - 10.2 mg/dL   Total Protein 6.5 6.0 - 8.5 g/dL   Albumin 3.7 (L) 3.8 - 4.8 g/dL   Globulin, Total 2.8 1.5 - 4.5 g/dL   Albumin/Globulin Ratio 1.3 1.2 - 2.2   Bilirubin Total 0.4 0.0 - 1.2 mg/dL   Alkaline Phosphatase 62 39 - 117 IU/L   AST 16  0 - 40 IU/L   ALT 13 0 - 32 IU/L  Lipid panel     Status: Abnormal   Collection Time: 09/10/18  9:01 AM  Result Value Ref Range   Cholesterol, Total 165 100 - 199 mg/dL   Triglycerides 469216 (H) 0 - 149 mg/dL   HDL 37 (L) >62>39 mg/dL   VLDL Cholesterol Cal 43 (H) 5 - 40 mg/dL   LDL Calculated 85 0 - 99 mg/dL   Chol/HDL Ratio 4.5 (H) 0.0 - 4.4 ratio    Comment:                                   T. Chol/HDL Ratio                                             Men  Women                               1/2 Avg.Risk  3.4    3.3                                   Avg.Risk  5.0    4.4                                2X Avg.Risk  9.6    7.1                                3X Avg.Risk 23.4   11.0     -------------------------------------------------------------------------- A&P:  Problem List Items Addressed This Visit    Essential hypertension - Primary    Reportedly controlled BP still, improving lifestyle - Home BP readings none  No known complications     Plan:  1. Continue current BP regimen - Lisinopril 10mg  daily 2. Encourage improved lifestyle - low sodium diet, regular exercise 3. Future can resume monitor BP outside office, bring readings to next visit, if persistently >140/90 or new symptoms notify office sooner 4. Follow-up 6 months       Mixed hyperlipidemia    Improving cholesterol mostly hypertriglyceridemia on improving lifestyle Last lipid panel 09/2018  Plan: 1. START OTC FIsh Oil Omega 3 - 1,000 mg daily - already has - not started yet 2. Encourage improved lifestyle - low carb/cholesterol, reduce portion size, continue improving  regular exercise  Can do yearly lipids at this point if preferred         No orders of the defined types were  placed in this encounter.   Follow-up: - Return in 6 months for HTN, HLD, Weight w/ PCP  - Works for [employer] will have her yearly biometric in the future  Patient verbalizes understanding with the above medical  recommendations including the limitation of remote medical advice.  Specific follow-up and call-back criteria were given for patient to follow-up or seek medical care more urgently if needed.   - Time spent in direct consultation with patient on phone: 15 minutes   Saralyn Pilar, DO [facility] 09/24/2018, 11:20 AM

## 2018-09-24 NOTE — Patient Instructions (Addendum)
Keep up good work overall!   Continue with exercise regimen.  Cholesterol is improving  You can add Fish Oil Omega 3 - 1,000 per pill once daily with meal, if want can take 1 twice a day.   Please schedule a Follow-up Appointment to: Return in about 6 months (around 03/27/2019) for HTN, HLD, Weight.  If you have any other questions or concerns, please feel free to call the office or send a message through MyChart. You may also schedule an earlier appointment if necessary.  Additionally, you may be receiving a survey about your experience at our office within a few days to 1 week by e-mail or mail. We value your feedback.  Saralyn Pilar, DO [facility], [place]

## 2018-09-29 ENCOUNTER — Other Ambulatory Visit: Payer: Self-pay | Admitting: Nurse Practitioner

## 2018-09-29 DIAGNOSIS — I10 Essential (primary) hypertension: Secondary | ICD-10-CM

## 2018-10-14 ENCOUNTER — Ambulatory Visit (INDEPENDENT_AMBULATORY_CARE_PROVIDER_SITE_OTHER): Payer: Managed Care, Other (non HMO) | Admitting: Obstetrics and Gynecology

## 2018-10-14 ENCOUNTER — Encounter: Payer: Self-pay | Admitting: Obstetrics and Gynecology

## 2018-10-14 ENCOUNTER — Other Ambulatory Visit: Payer: Self-pay

## 2018-10-14 VITALS — BP 130/80 | Ht 61.5 in | Wt 223.6 lb

## 2018-10-14 DIAGNOSIS — Z3041 Encounter for surveillance of contraceptive pills: Secondary | ICD-10-CM

## 2018-10-14 DIAGNOSIS — Z1239 Encounter for other screening for malignant neoplasm of breast: Secondary | ICD-10-CM

## 2018-10-14 DIAGNOSIS — Z124 Encounter for screening for malignant neoplasm of cervix: Secondary | ICD-10-CM

## 2018-10-14 DIAGNOSIS — Z01419 Encounter for gynecological examination (general) (routine) without abnormal findings: Secondary | ICD-10-CM | POA: Diagnosis not present

## 2018-10-14 MED ORDER — NORETHINDRONE 0.35 MG PO TABS: 1.0000 | ORAL_TABLET | Freq: Every day | ORAL | 3 refills | Status: DC

## 2018-10-14 NOTE — Progress Notes (Signed)
Chief Complaint  Patient presents with  . Gynecologic Exam  . [facility] Employee     HPI:      Tina Hurley is a 48 y.o. G1P1001 who LMP was Patient's last menstrual period was 10/05/2018 (approximate)., presents today for her annual examination. Her menses are regular every 1-2 months with cont dosing OCPs, lasting 2-5days. Dysmenorrhea none. She does not have intermenstrual bleeding. Pt on OCPs for cycle control. Has new dx of HTN and is on lisinopril.   Sex activity: not sexually active.  Last Pap: 09/28/17  Results were: no abnormalities /neg HPV DNA.  Pt likes yearly paps.  Hx of STDs: none  Last mammogram: 11/26/17 Results were: normal--routine follow-up in 12 months There is a FH of breast cancer in her MGM and mat grt aunt, genetic testing not indicated. There is no FH of ovarian cancer. The patient does do self-breast exams.  Tobacco use: The patient denies current or previous tobacco use. Alcohol use: social drinker Exercise: moderately active  She does get adequate calcium and Vitamin D in her diet.  Labs with PCP/work.   Past Medical History:  Diagnosis Date  . Allergy   . Hypertension   . Menstrual migraine     Past Surgical History:  Procedure Laterality Date  . MOUTH SURGERY    . WISDOM TOOTH EXTRACTION      Family History  Problem Relation Age of Onset  . Breast cancer Maternal Grandmother 60  . Colon cancer Maternal Grandmother 462  . Liver cancer Maternal Grandmother   . Hypertension Mother   . Diabetes Mother   . Hypertension Father   . Diabetes Father   . Heart disease Father   . Heart attack Father   . Obesity Father   . Hypertension Sister   . Dementia Maternal Grandfather   . Heart attack Paternal Grandfather     Social History   Socioeconomic History  . Marital status: Divorced    Spouse name: Not on file  . Number of children: Not on file  . Years of education: Not on file  . Highest education level: Some college, no  degree  Occupational History  . Not on file  Social Needs  . Financial resource strain: Not hard at all  . Food insecurity:    Worry: Never true    Inability: Never true  . Transportation needs:    Medical: No    Non-medical: No  Tobacco Use  . Smoking status: Never Smoker  . Smokeless tobacco: Never Used  Substance and Sexual Activity  . Alcohol use: Yes    Comment: occassionally   . Drug use: No  . Sexual activity: Not Currently    Birth control/protection: Pill  Lifestyle  . Physical activity:    Days per week: 3 days    Minutes per session: 120 min  . Stress: Only a little  Relationships  . Social connections:    Talks on phone: Twice a week    Gets together: Twice a week    Attends religious service: Never    Active member of club or organization: No    Attends meetings of clubs or organizations: Never    Relationship status: Divorced  . Intimate partner violence:    Fear of current or ex partner: No    Emotionally abused: No    Physically abused: No    Forced sexual activity: No  Other Topics Concern  . Not on file  Social History Narrative  .  Not on file    Current Outpatient Medications on File Prior to Visit  Medication Sig Dispense Refill  . lisinopril (ZESTRIL) 10 MG tablet TAKE 1 TABLET BY MOUTH  DAILY 90 tablet 1  . loratadine (CLARITIN) 10 MG tablet Take 10 mg by mouth daily.    . Multiple Vitamin (MULTIVITAMIN) tablet Take 1 tablet by mouth daily.    Marland Kitchen b complex vitamins tablet Take 1 tablet by mouth daily.     No current facility-administered medications on file prior to visit.     ROS:  Review of Systems  Constitutional: Negative for fatigue, fever and unexpected weight change.  Respiratory: Negative for cough, shortness of breath and wheezing.   Cardiovascular: Negative for chest pain, palpitations and leg swelling.  Gastrointestinal: Negative for blood in stool, constipation, diarrhea, nausea and vomiting.  Endocrine: Negative for cold  intolerance, heat intolerance and polyuria.  Genitourinary: Negative for dyspareunia, dysuria, flank pain, frequency, genital sores, hematuria, menstrual problem, pelvic pain, urgency, vaginal bleeding, vaginal discharge and vaginal pain.  Musculoskeletal: Negative for back pain, joint swelling and myalgias.  Skin: Negative for rash.  Neurological: Negative for dizziness, syncope, light-headedness, numbness and headaches.  Hematological: Negative for adenopathy.  Psychiatric/Behavioral: Negative for agitation, confusion, sleep disturbance and suicidal ideas. The patient is not nervous/anxious.      Objective: BP 130/80   Ht 5' 1.5" (1.562 m)   Wt 223 lb 9.6 oz (101.4 kg)   LMP 10/05/2018 (Approximate)   BMI 41.56 kg/m    Physical Exam Constitutional:      Appearance: She is well-developed.  Genitourinary:     Vulva, vagina, uterus, right adnexa and left adnexa normal.     No vulval lesion or tenderness noted.     No vaginal discharge, erythema or tenderness.     No cervical motion tenderness or polyp.     Uterus is not enlarged or tender.     No right or left adnexal mass present.     Right adnexa not tender.     Left adnexa not tender.  Neck:     Musculoskeletal: Normal range of motion.     Thyroid: No thyromegaly.  Cardiovascular:     Rate and Rhythm: Normal rate and regular rhythm.     Heart sounds: Normal heart sounds. No murmur.  Pulmonary:     Effort: Pulmonary effort is normal.     Breath sounds: Normal breath sounds.  Chest:     Breasts:        Right: No mass, nipple discharge, skin change or tenderness.        Left: No mass, nipple discharge, skin change or tenderness.  Abdominal:     Palpations: Abdomen is soft.     Tenderness: There is no abdominal tenderness. There is no guarding.  Musculoskeletal: Normal range of motion.  Neurological:     General: No focal deficit present.     Mental Status: She is alert and oriented to person, place, and time.      Cranial Nerves: No cranial nerve deficit.  Skin:    General: Skin is warm and dry.  Psychiatric:        Mood and Affect: Mood normal.        Behavior: Behavior normal.        Thought Content: Thought content normal.        Judgment: Judgment normal.  Vitals signs reviewed.     Assessment/Plan: Encounter for annual routine gynecological examination  Cervical cancer screening -  Pt likes yearly paps - Plan: Pap IG (Image Guided)  Screening for breast cancer - Pt to sched mammo - Plan: MM 3D SCREEN BREAST BILATERAL  Encounter for surveillance of contraceptive pills - BC change to camila due to new dx HTN, sent to [facility]. Offered slynd for cycle control but pt would prefer to try generic first.  - Plan: norethindrone (MICRONOR) 0.35 MG tablet  Meds ordered this encounter  Medications  . norethindrone (MICRONOR) 0.35 MG tablet    Sig: Take 1 tablet (0.35 mg total) by mouth daily.    Dispense:  84 tablet    Refill:  3    Order Specific Question:   Supervising Provider    Answer:   Nadara Mustard [[id number]]             GYN counsel breast self exam, mammography screening, adequate intake of calcium and vitamin D, diet and exercise     F/U  Return in about 1 year (around 10/14/2019).   B. , PA-C 10/14/2018 2:25 PM

## 2018-10-14 NOTE — Patient Instructions (Signed)
I value your feedback and entrusting us with your care. If you get a [place] patient survey, I would appreciate you taking the time to let us know about your experience today. Thank you!  [facility]: [phone]    

## 2018-10-19 LAB — PAP IG (IMAGE GUIDED)

## 2018-12-03 ENCOUNTER — Ambulatory Visit
Admission: RE | Admit: 2018-12-03 | Discharge: 2018-12-03 | Disposition: A | Discharge status: Home / Self Care | Payer: Managed Care, Other (non HMO) | Source: Ambulatory Visit | Attending: Obstetrics and Gynecology | Admitting: Obstetrics and Gynecology

## 2018-12-03 DIAGNOSIS — Z1231 Encounter for screening mammogram for malignant neoplasm of breast: Secondary | ICD-10-CM | POA: Diagnosis not present

## 2018-12-03 DIAGNOSIS — Z1239 Encounter for other screening for malignant neoplasm of breast: Secondary | ICD-10-CM | POA: Diagnosis present

## 2018-12-04 ENCOUNTER — Encounter: Payer: Self-pay | Admitting: Obstetrics and Gynecology

## 2019-02-20 ENCOUNTER — Other Ambulatory Visit: Payer: Self-pay | Admitting: Family Medicine

## 2019-02-20 DIAGNOSIS — I10 Essential (primary) hypertension: Secondary | ICD-10-CM

## 2019-05-12 ENCOUNTER — Other Ambulatory Visit: Payer: Self-pay | Admitting: Nurse Practitioner

## 2019-05-12 DIAGNOSIS — I10 Essential (primary) hypertension: Secondary | ICD-10-CM

## 2019-09-14 ENCOUNTER — Telehealth: Payer: Self-pay

## 2019-09-14 ENCOUNTER — Other Ambulatory Visit: Payer: Self-pay | Admitting: Family Medicine

## 2019-09-14 DIAGNOSIS — Z0000 Encounter for general adult medical examination without abnormal findings: Secondary | ICD-10-CM

## 2019-09-14 NOTE — Telephone Encounter (Signed)
Lab orders placed for [facility]

## 2019-09-14 NOTE — Telephone Encounter (Signed)
Copied from CRM [id number]. Topic: General - Other >> Sep 14, 2019 10:29 AM Elliot Gault wrote: Reason for CRM: patient requesting pre visit CPE lab orders please place with [facility] [address] [place], [place] [address]. Patient scheduled for CPE with NP on 5/21. Patient is in network with [employer] and would like a follow up call when orders have been placed

## 2019-09-14 NOTE — Progress Notes (Signed)
Lab orders placed for upcoming CPE

## 2019-09-14 NOTE — Telephone Encounter (Signed)
The pt was notified, no questions or concerns. 

## 2019-09-17 LAB — CBC WITH DIFFERENTIAL/PLATELET
Basophils Absolute: 0.1 10*3/uL (ref 0.0–0.2)
Basos: 1 %
EOS (ABSOLUTE): 0.1 10*3/uL (ref 0.0–0.4)
Eos: 1 %
Hematocrit: 43.3 % (ref 34.0–46.6)
Hemoglobin: 14.5 g/dL (ref 11.1–15.9)
Immature Grans (Abs): 0.2 10*3/uL — ABNORMAL HIGH (ref 0.0–0.1)
Immature Granulocytes: 1 %
Lymphocytes Absolute: 4.2 10*3/uL — ABNORMAL HIGH (ref 0.7–3.1)
Lymphs: 30 %
MCH: 30.5 pg (ref 26.6–33.0)
MCHC: 33.5 g/dL (ref 31.5–35.7)
MCV: 91 fL (ref 79–97)
Monocytes Absolute: 0.8 10*3/uL (ref 0.1–0.9)
Monocytes: 6 %
Neutrophils Absolute: 8.9 10*3/uL — ABNORMAL HIGH (ref 1.4–7.0)
Neutrophils: 61 %
Platelets: 399 10*3/uL (ref 150–450)
RBC: 4.76 x10E6/uL (ref 3.77–5.28)
RDW: 11.8 % (ref 11.7–15.4)
WBC: 14.3 10*3/uL — ABNORMAL HIGH (ref 3.4–10.8)

## 2019-09-17 LAB — CMP14+EGFR
ALT: 20 IU/L (ref 0–32)
AST: 16 IU/L (ref 0–40)
Albumin/Globulin Ratio: 1.4 (ref 1.2–2.2)
Albumin: 4.1 g/dL (ref 3.8–4.8)
Alkaline Phosphatase: 86 IU/L (ref 39–117)
BUN/Creatinine Ratio: 11 (ref 9–23)
BUN: 9 mg/dL (ref 6–24)
Bilirubin Total: 0.4 mg/dL (ref 0.0–1.2)
CO2: 20 mmol/L (ref 20–29)
Calcium: 9.6 mg/dL (ref 8.7–10.2)
Chloride: 102 mmol/L (ref 96–106)
Creatinine, Ser: 0.8 mg/dL (ref 0.57–1.00)
GFR calc Af Amer: 100 mL/min/{1.73_m2} (ref >59)
GFR calc non Af Amer: 87 mL/min/{1.73_m2} (ref >59)
Globulin, Total: 2.9 g/dL (ref 1.5–4.5)
Glucose: 86 mg/dL (ref 65–99)
Potassium: 4.4 mmol/L (ref 3.5–5.2)
Sodium: 138 mmol/L (ref 134–144)
Total Protein: 7 g/dL (ref 6.0–8.5)

## 2019-09-17 LAB — LIPID PANEL
Chol/HDL Ratio: 5 ratio — ABNORMAL HIGH (ref 0.0–4.4)
Cholesterol, Total: 171 mg/dL (ref 100–199)
HDL: 34 mg/dL — ABNORMAL LOW (ref >39)
LDL Chol Calc (NIH): 101 mg/dL — ABNORMAL HIGH (ref 0–99)
Triglycerides: 209 mg/dL — ABNORMAL HIGH (ref 0–149)
VLDL Cholesterol Cal: 36 mg/dL (ref 5–40)

## 2019-09-17 LAB — THYROID PANEL WITH TSH
Free Thyroxine Index: 1.6 (ref 1.2–4.9)
T3 Uptake Ratio: 24 % (ref 24–39)
T4, Total: 6.6 ug/dL (ref 4.5–12.0)
TSH: 2.4 u[IU]/mL (ref 0.450–4.500)

## 2019-09-30 ENCOUNTER — Ambulatory Visit (INDEPENDENT_AMBULATORY_CARE_PROVIDER_SITE_OTHER): Payer: Managed Care, Other (non HMO) | Admitting: Family Medicine

## 2019-09-30 ENCOUNTER — Encounter: Payer: Self-pay | Admitting: Family Medicine

## 2019-09-30 ENCOUNTER — Other Ambulatory Visit: Payer: Self-pay

## 2019-09-30 VITALS — BP 132/84 | HR 90 | Temp 97.7°F | Resp 17 | Ht 61.5 in | Wt 229.6 lb

## 2019-09-30 DIAGNOSIS — R899 Unspecified abnormal finding in specimens from other organs, systems and tissues: Secondary | ICD-10-CM | POA: Insufficient documentation

## 2019-09-30 DIAGNOSIS — Z0000 Encounter for general adult medical examination without abnormal findings: Secondary | ICD-10-CM | POA: Diagnosis not present

## 2019-09-30 NOTE — Progress Notes (Signed)
Subjective:    Patient ID: ASLI TOKARSKI, female    DOB: [birth year], 49 y.o.   MRN: [id number]  JANEESE MCGLOIN is a 49 y.o. female presenting on 09/30/2019 for Annual Exam   HPI  HEALTH MAINTENANCE:  Weight/BMI: 10lb weight gain since last visit in office 03/19/2018 Physical activity: No structured physical activity Diet: Regular Seatbelt: Always Sunscreen: Wears Mammogram: Completed 12/03/2018 PAP: Completed 10/14/2018 Optometry: Yearly Dentistry: Regularly  IMMUNIZATIONS: Influenza: Due next season Tetanus: Unsure if up to date COVID: Discussed  Depression screen [facility] 2/9 09/30/2019 02/05/2018  Decreased Interest 0 0  Down, Depressed, Hopeless - 0  PHQ - 2 Score 0 0    Past Medical History:  Diagnosis Date  . Allergy   . Hypertension   . Menstrual migraine    Past Surgical History:  Procedure Laterality Date  . MOUTH SURGERY    . WISDOM TOOTH EXTRACTION     Social History   Socioeconomic History  . Marital status: Divorced    Spouse name: Not on file  . Number of children: Not on file  . Years of education: Not on file  . Highest education level: Some college, no degree  Occupational History  . Not on file  Tobacco Use  . Smoking status: Never Smoker  . Smokeless tobacco: Never Used  Substance and Sexual Activity  . Alcohol use: Yes    Comment: occassionally   . Drug use: No  . Sexual activity: Not Currently    Birth control/protection: Pill  Other Topics Concern  . Not on file  Social History Narrative  . Not on file   Social Determinants of Health   Financial Resource Strain:   . Difficulty of Paying Living Expenses:   Food Insecurity:   . Worried About Charity fundraiser in the Last Year:   . Arboriculturist in the Last Year:   Transportation Needs:   . Film/video editor (Medical):   Marland Kitchen Lack of Transportation (Non-Medical):   Physical Activity:   . Days of Exercise per Week:   . Minutes of Exercise per Session:   Stress:   .  Feeling of Stress :   Social Connections:   . Frequency of Communication with Friends and Family:   . Frequency of Social Gatherings with Friends and Family:   . Attends Religious Services:   . Active Member of Clubs or Organizations:   . Attends Archivist Meetings:   Marland Kitchen Marital Status:   Intimate Partner Violence:   . Fear of Current or Ex-Partner:   . Emotionally Abused:   Marland Kitchen Physically Abused:   . Sexually Abused:    Family History  Problem Relation Age of Onset  . Breast cancer Maternal Grandmother 60  . Colon cancer Maternal Grandmother 48  . Liver cancer Maternal Grandmother   . Hypertension Mother   . Diabetes Mother   . Hypertension Father   . Diabetes Father   . Heart disease Father   . Heart attack Father   . Obesity Father   . Hypertension Sister   . Dementia Maternal Grandfather   . Heart attack Paternal Grandfather    Current Outpatient Medications on File Prior to Visit  Medication Sig  . b complex vitamins tablet Take 1 tablet by mouth daily.  Marland Kitchen lisinopril (ZESTRIL) 10 MG tablet TAKE 1 TABLET BY MOUTH  DAILY  . loratadine (CLARITIN) 10 MG tablet Take 10 mg by mouth daily.  . Multiple Vitamin (MULTIVITAMIN)  tablet Take 1 tablet by mouth daily.  . norethindrone (MICRONOR) 0.35 MG tablet Take 1 tablet (0.35 mg total) by mouth daily. (Patient not taking: Reported on 09/30/2019)   No current facility-administered medications on file prior to visit.    Per HPI unless specifically indicated above     Objective:    BP 132/84 (BP Location: Left Arm, Patient Position: Sitting, Cuff Size: Large)   Pulse 90   Temp 97.7 F (36.5 C) (Temporal)   Resp 17   Ht 5' 1.5" (1.562 m)   Wt 229 lb 9.6 oz (104.1 kg)   SpO2 100%   BMI 42.68 kg/m   Wt Readings from Last 3 Encounters:  09/30/19 229 lb 9.6 oz (104.1 kg)  10/14/18 223 lb 9.6 oz (101.4 kg)  03/19/18 219 lb (99.3 kg)   Physical Exam Vitals reviewed.  Constitutional:      General: She is not in  acute distress.    Appearance: Normal appearance. She is well-developed and well-groomed. She is obese. She is not ill-appearing or toxic-appearing.  HENT:     Head: Normocephalic.     Right Ear: Tympanic membrane, ear canal and external ear normal. There is no impacted cerumen.     Left Ear: Tympanic membrane, ear canal and external ear normal. There is no impacted cerumen.     Nose: Nose normal. No congestion or rhinorrhea.     Mouth/Throat:     Lips: Pink.     Mouth: Mucous membranes are moist.     Pharynx: Oropharynx is clear. Uvula midline. No oropharyngeal exudate or posterior oropharyngeal erythema.  Eyes:     General: Lids are normal. Vision grossly intact. No scleral icterus.       Right eye: No discharge.        Left eye: No discharge.     Extraocular Movements: Extraocular movements intact.     Conjunctiva/sclera: Conjunctivae normal.     Pupils: Pupils are equal, round, and reactive to light.  Neck:     Thyroid: No thyroid mass or thyromegaly.  Cardiovascular:     Rate and Rhythm: Normal rate and regular rhythm.     Pulses: Normal pulses.          Dorsalis pedis pulses are 2+ on the right side and 2+ on the left side.     Heart sounds: Normal heart sounds. No murmur. No friction rub. No gallop.   Pulmonary:     Effort: Pulmonary effort is normal. No respiratory distress.     Breath sounds: Normal breath sounds.  Abdominal:     General: Abdomen is flat. Bowel sounds are normal. There is no distension.     Palpations: Abdomen is soft. There is no hepatomegaly, splenomegaly or mass.     Tenderness: There is no abdominal tenderness. There is no guarding or rebound.     Hernia: No hernia is present.  Musculoskeletal:        General: Normal range of motion.     Cervical back: Normal range of motion and neck supple. No tenderness.     Right lower leg: No edema.     Left lower leg: No edema.     Comments: 5/5 strength in bilateral upper/lower extremities  Feet:     Right  foot:     Skin integrity: Skin integrity normal.     Left foot:     Skin integrity: Skin integrity normal.  Lymphadenopathy:     Cervical: No cervical adenopathy.  Right cervical: No superficial cervical adenopathy.    Left cervical: No superficial cervical adenopathy.  Skin:    General: Skin is warm and dry.     Capillary Refill: Capillary refill takes less than 2 seconds.  Neurological:     General: No focal deficit present.     Mental Status: She is alert and oriented to person, place, and time.     Cranial Nerves: No cranial nerve deficit.     Sensory: No sensory deficit.     Motor: No weakness.     Coordination: Coordination normal.     Gait: Gait normal.     Deep Tendon Reflexes: Reflexes normal.  Psychiatric:        Attention and Perception: Attention and perception normal.        Mood and Affect: Mood and affect normal.        Speech: Speech normal.        Behavior: Behavior normal. Behavior is cooperative.        Thought Content: Thought content normal.        Cognition and Memory: Cognition and memory normal.        Judgment: Judgment normal.     Results for orders placed or performed in visit on 09/14/19  CBC with Differential  Result Value Ref Range   WBC 14.3 (H) 3.4 - 10.8 x10E3/uL   RBC 4.76 3.77 - 5.28 x10E6/uL   Hemoglobin 14.5 11.1 - 15.9 g/dL   Hematocrit 43.3 34.0 - 46.6 %   MCV 91 79 - 97 fL   MCH 30.5 26.6 - 33.0 pg   MCHC 33.5 31.5 - 35.7 g/dL   RDW 11.8 11.7 - 15.4 %   Platelets 399 150 - 450 x10E3/uL   Neutrophils 61 Not Estab. %   Lymphs 30 Not Estab. %   Monocytes 6 Not Estab. %   Eos 1 Not Estab. %   Basos 1 Not Estab. %   Neutrophils Absolute 8.9 (H) 1.4 - 7.0 x10E3/uL   Lymphocytes Absolute 4.2 (H) 0.7 - 3.1 x10E3/uL   Monocytes Absolute 0.8 0.1 - 0.9 x10E3/uL   EOS (ABSOLUTE) 0.1 0.0 - 0.4 x10E3/uL   Basophils Absolute 0.1 0.0 - 0.2 x10E3/uL   Immature Granulocytes 1 Not Estab. %   Immature Grans (Abs) 0.2 (H) 0.0 - 0.1 x10E3/uL    CMP14+EGFR  Result Value Ref Range   Glucose 86 65 - 99 mg/dL   BUN 9 6 - 24 mg/dL   Creatinine, Ser 0.80 0.57 - 1.00 mg/dL   GFR calc non Af Amer 87 >59 mL/min/1.73   GFR calc Af Amer 100 >59 mL/min/1.73   BUN/Creatinine Ratio 11 9 - 23   Sodium 138 134 - 144 mmol/L   Potassium 4.4 3.5 - 5.2 mmol/L   Chloride 102 96 - 106 mmol/L   CO2 20 20 - 29 mmol/L   Calcium 9.6 8.7 - 10.2 mg/dL   Total Protein 7.0 6.0 - 8.5 g/dL   Albumin 4.1 3.8 - 4.8 g/dL   Globulin, Total 2.9 1.5 - 4.5 g/dL   Albumin/Globulin Ratio 1.4 1.2 - 2.2   Bilirubin Total 0.4 0.0 - 1.2 mg/dL   Alkaline Phosphatase 86 39 - 117 IU/L   AST 16 0 - 40 IU/L   ALT 20 0 - 32 IU/L  Lipid Profile  Result Value Ref Range   Cholesterol, Total 171 100 - 199 mg/dL   Triglycerides 209 (H) 0 - 149 mg/dL   HDL 34 (L) >  39 mg/dL   VLDL Cholesterol Cal 36 5 - 40 mg/dL   LDL Chol Calc (NIH) 101 (H) 0 - 99 mg/dL   Chol/HDL Ratio 5.0 (H) 0.0 - 4.4 ratio  Thyroid Panel With TSH  Result Value Ref Range   TSH 2.400 0.450 - 4.500 uIU/mL   T4, Total 6.6 4.5 - 12.0 ug/dL   T3 Uptake Ratio 24 24 - 39 %   Free Thyroxine Index 1.6 1.2 - 4.9      Assessment & Plan:   Problem List Items Addressed This Visit      Other   Routine medical exam    Annual physical exam without new exam findings.  Well adult with no acute concerns.  Plan: 1. Obtain health maintenance screenings as above according to age. - Increase physical activity to 30 minutes most days of the week.  - Eat healthy diet high in vegetables and fruits; low in refined carbohydrates. - Screening labs and tests as ordered 2. Return 1 year for annual physical.       Abnormal laboratory test result - Primary    Abnormal WBC count on labs from 09/16/2019.  Discussed will have labs repeated and if WBC remains elevated will order additional testing/referral to hematology.  Patient in agreement with plan.      Relevant Orders   Urinalysis, Routine w reflex microscopic    CBC with Differential      No orders of the defined types were placed in this encounter.     Follow up plan: No follow-ups on file.  Harlin Rain, FNP-C Family Nurse Practitioner [facility] 09/30/2019, 2:05 PM

## 2019-09-30 NOTE — Assessment & Plan Note (Signed)
Annual physical exam without new exam findings.  Well adult with no acute concerns.  Plan: 1. Obtain health maintenance screenings as above according to age. - Increase physical activity to 30 minutes most days of the week.  - Eat healthy diet high in vegetables and fruits; low in refined carbohydrates. - Screening labs and tests as ordered 2. Return 1 year for annual physical.

## 2019-09-30 NOTE — Assessment & Plan Note (Signed)
Abnormal WBC count on labs from 09/16/2019.  Discussed will have labs repeated and if WBC remains elevated will order additional testing/referral to hematology.  Patient in agreement with plan.

## 2019-09-30 NOTE — Patient Instructions (Addendum)
Have your labs drawn and we will be in touch when we receive the results  Well Visit, Ages 70 to 59: Care Instructions Overview  Well visits can help you stay healthy. Your provider has checked your overall health and may have suggested ways to take good care of yourself. Your provider also may have recommended tests. At home, you can help prevent illness with healthy eating, regular exercise, and other steps.  Follow-up care is a key part of your treatment and safety. Be sure to make and go to all appointments, and call your provider if you are having problems. It's also a good idea to know your test results and keep a list of the medicines you take.  How can you care for yourself at home?   Get screening tests that you and your doctor decide on. Screening helps find diseases before any symptoms appear.   Eat healthy foods. Choose fruits, vegetables, whole grains, protein, and low-fat dairy foods. Limit fat, especially saturated fat. Reduce salt in your diet.   Limit alcohol. If you are a man, have no more than 2 drinks a day or 14 drinks a week. If you are a woman, have no more than 1 drink a day or 7 drinks a week.   Get at least 30 minutes of physical activity on most days of the week.  We recommend you go no more than 2 days in a row without exercise. Walking is a good choice. You also may want to do other activities, such as running, swimming, cycling, or playing tennis or team sports. Discuss any changes in your exercise program with your provider.   Reach and stay at a healthy weight. This will lower your risk for many problems, such as obesity, diabetes, heart disease, and high blood pressure.   Do not smoke or allow others to smoke around you. If you need help quitting, talk to your provider about stop-smoking programs and medicines. These can increase your chances of quitting for good.  Can call 1-800-QUIT-NOW (513 249 6690) for the [facility], assistance with  smoking cessation.   Care for your mental health. It is easy to get weighed down by worry and stress. Learn strategies to manage stress, like deep breathing and mindfulness, and stay connected with your family and community. If you find you often feel sad or hopeless, talk with your provider. Treatment can help.   Talk to your provider about whether you have any risk factors for sexually transmitted infections (STIs). You can help prevent STIs if you wait to have sex with a new partner (or partners) until you've each been tested for STIs. It also helps if you use condoms (female or female condoms) and if you limit your sex partners to one person who only has sex with you. Vaccines are available for some STIs, such as HPV (these are age dependent).   Use birth control if it's important to you to prevent pregnancy. Talk with your provider about the choices available and what might be best for you.   If you think you may have a problem with alcohol or drug use, talk to your provider. This includes prescription medicines (such as amphetamines and opioids) and illegal drugs (such as cocaine and methamphetamine). Your provider can help you figure out what type of treatment is best for you.   If you have concerns about domestic violence or intimate partner violence, there are resources available to you. National Domestic Abuse Hotline 504 077 0440   Protect your skin  from too much sun. When you're outdoors from 10 a.m. to 4 p.m., stay in the shade or cover up with clothing and a hat with a wide brim. Wear sunglasses that block UV rays. Even when it's cloudy, put broad-spectrum sunscreen (SPF 30 or higher) on any exposed skin.   See a dentist one or two times a year for checkups and to have your teeth cleaned.   See an eye doctor once per year for an eye exam.   Wear a seat belt in the car.  When should you call for help?  Watch closely for changes in your health, and be sure to contact your  provider if you have any problems or symptoms that concern you.  We will plan to see you back in 1 year for your next physical  You will receive a survey after today's visit either digitally by e-mail or paper by USPS mail. Your experiences and feedback matter to [place].  Please respond so we know how we are doing as we provide care for you.  Call us with any questions/concerns/needs.  It is my goal to be available to you for your health concerns.  Thanks for choosing me to be a partner in your healthcare needs!  Charlaine Dalton, FNP-C Family Nurse Practitioner [facility] Phone: [phone]

## 2019-10-08 LAB — URINALYSIS, ROUTINE W REFLEX MICROSCOPIC
Bilirubin, UA: NEGATIVE
Glucose, UA: NEGATIVE
Ketones, UA: NEGATIVE
Leukocytes,UA: NEGATIVE
Nitrite, UA: NEGATIVE
Protein,UA: NEGATIVE
RBC, UA: NEGATIVE
Specific Gravity, UA: 1.013 (ref 1.005–1.030)
Urobilinogen, Ur: 0.2 mg/dL (ref 0.2–1.0)
pH, UA: 6.5 (ref 5.0–7.5)

## 2019-10-08 LAB — CBC WITH DIFFERENTIAL/PLATELET
Basophils Absolute: 0.1 10*3/uL (ref 0.0–0.2)
Basos: 1 %
EOS (ABSOLUTE): 0.2 10*3/uL (ref 0.0–0.4)
Eos: 1 %
Hematocrit: 44.9 % (ref 34.0–46.6)
Hemoglobin: 15.1 g/dL (ref 11.1–15.9)
Immature Grans (Abs): 0.1 10*3/uL (ref 0.0–0.1)
Immature Granulocytes: 1 %
Lymphocytes Absolute: 5.1 10*3/uL — ABNORMAL HIGH (ref 0.7–3.1)
Lymphs: 34 %
MCH: 30.5 pg (ref 26.6–33.0)
MCHC: 33.6 g/dL (ref 31.5–35.7)
MCV: 91 fL (ref 79–97)
Monocytes Absolute: 0.9 10*3/uL (ref 0.1–0.9)
Monocytes: 6 %
Neutrophils Absolute: 8.5 10*3/uL — ABNORMAL HIGH (ref 1.4–7.0)
Neutrophils: 57 %
Platelets: 436 10*3/uL (ref 150–450)
RBC: 4.95 x10E6/uL (ref 3.77–5.28)
RDW: 12.3 % (ref 11.7–15.4)
WBC: 14.9 10*3/uL — ABNORMAL HIGH (ref 3.4–10.8)

## 2019-10-11 ENCOUNTER — Other Ambulatory Visit: Payer: Self-pay | Admitting: Family Medicine

## 2019-10-11 DIAGNOSIS — D72829 Elevated white blood cell count, unspecified: Secondary | ICD-10-CM

## 2019-10-21 ENCOUNTER — Inpatient Hospital Stay: Payer: Managed Care, Other (non HMO) | Attending: Oncology | Admitting: Oncology

## 2019-10-21 ENCOUNTER — Other Ambulatory Visit: Payer: Self-pay

## 2019-10-21 ENCOUNTER — Encounter: Payer: Self-pay | Admitting: Oncology

## 2019-10-21 ENCOUNTER — Inpatient Hospital Stay: Payer: Managed Care, Other (non HMO)

## 2019-10-21 VITALS — BP 160/93 | HR 98 | Temp 98.5°F | Resp 16 | Wt 229.3 lb

## 2019-10-21 DIAGNOSIS — E782 Mixed hyperlipidemia: Secondary | ICD-10-CM | POA: Diagnosis not present

## 2019-10-21 DIAGNOSIS — Z79899 Other long term (current) drug therapy: Secondary | ICD-10-CM | POA: Insufficient documentation

## 2019-10-21 DIAGNOSIS — E669 Obesity, unspecified: Secondary | ICD-10-CM | POA: Insufficient documentation

## 2019-10-21 DIAGNOSIS — D72820 Lymphocytosis (symptomatic): Secondary | ICD-10-CM | POA: Diagnosis not present

## 2019-10-21 DIAGNOSIS — K219 Gastro-esophageal reflux disease without esophagitis: Secondary | ICD-10-CM | POA: Insufficient documentation

## 2019-10-21 DIAGNOSIS — D729 Disorder of white blood cells, unspecified: Secondary | ICD-10-CM

## 2019-10-21 DIAGNOSIS — I10 Essential (primary) hypertension: Secondary | ICD-10-CM | POA: Diagnosis not present

## 2019-10-24 ENCOUNTER — Encounter: Payer: Self-pay | Admitting: Oncology

## 2019-10-24 NOTE — Progress Notes (Signed)
Hematology/Oncology Consult note [facility] [phone] [phone]  Patient Care Team: Verl Bangs, FNP as PCP - General (Family Medicine)   Name of the patient: Tina Hurley  [id number]  05-Feb-1971    Reason for referral- leucoytosis   Referring [place], FNP  Date of visit: 10/24/19   History of presenting illness- Patient is a 49 year old female with a past medical history significant for for hypertension and GERD who has been referred to [place] for leukocytosis.  Most recent CBC from 10/07/2019 showed white count of 14.9, H&H of 15.1/24.9 and a platelet count of 436.  Differential mainly shows neutrophilia and lymphocytosis.  Her count 2 weeks prior to that was also elevated at 14.  I have another CBC for her from November 2017 when her white count was normal at 6.4.  Patient currently feels well and denies any recurrent infections or hospitalizations.  Her weight has remained stable.  Reports some ongoing fatigue.  ECOG PS- 1  Pain scale- 0   Review of systems- Review of Systems  Constitutional: Positive for malaise/fatigue. Negative for chills, fever and weight loss.  HENT: Negative for congestion, ear discharge and nosebleeds.   Eyes: Negative for blurred vision.  Respiratory: Negative for cough, hemoptysis, sputum production, shortness of breath and wheezing.   Cardiovascular: Negative for chest pain, palpitations, orthopnea and claudication.  Gastrointestinal: Negative for abdominal pain, blood in stool, constipation, diarrhea, heartburn, melena, nausea and vomiting.  Genitourinary: Negative for dysuria, flank pain, frequency, hematuria and urgency.  Musculoskeletal: Negative for back pain, joint pain and myalgias.  Skin: Negative for rash.  Neurological: Negative for dizziness, tingling, focal weakness, seizures, weakness and headaches.  Endo/Heme/Allergies: Does not bruise/bleed easily.    Psychiatric/Behavioral: Negative for depression and suicidal ideas. The patient does not have insomnia.     No Known Allergies  Patient Active Problem List   Diagnosis Date Noted  . Routine medical exam 09/30/2019  . Abnormal laboratory test result 09/30/2019  . Mixed hyperlipidemia 09/24/2018  . Essential hypertension 09/24/2018  . Obesity, unspecified 12/22/2017  . Encounter for other contraceptive management 07/29/2016     Past Medical History:  Diagnosis Date  . Allergy   . Hypertension   . Menstrual migraine      Past Surgical History:  Procedure Laterality Date  . MOUTH SURGERY    . WISDOM TOOTH EXTRACTION      Social History   Socioeconomic History  . Marital status: Divorced    Spouse name: Not on file  . Number of children: Not on file  . Years of education: Not on file  . Highest education level: Some college, no degree  Occupational History  . Not on file  Tobacco Use  . Smoking status: Never Smoker  . Smokeless tobacco: Never Used  Vaping Use  . Vaping Use: Never used  Substance and Sexual Activity  . Alcohol use: Yes    Comment: occassionally   . Drug use: No  . Sexual activity: Not Currently    Birth control/protection: Pill  Other Topics Concern  . Not on file  Social History Narrative  . Not on file   Social Determinants of Health   Financial Resource Strain:   . Difficulty of Paying Living Expenses:   Food Insecurity:   . Worried About Charity fundraiser in the Last Year:   . Arboriculturist in the Last Year:   Transportation Needs:   . Film/video editor (Medical):   Marland Kitchen  Lack of Transportation (Non-Medical):   Physical Activity:   . Days of Exercise per Week:   . Minutes of Exercise per Session:   Stress:   . Feeling of Stress :   Social Connections:   . Frequency of Communication with Friends and Family:   . Frequency of Social Gatherings with Friends and Family:   . Attends Religious Services:   . Active Member of  Clubs or Organizations:   . Attends Archivist Meetings:   Marland Kitchen Marital Status:   Intimate Partner Violence:   . Fear of Current or Ex-Partner:   . Emotionally Abused:   Marland Kitchen Physically Abused:   . Sexually Abused:      Family History  Problem Relation Age of Onset  . Breast cancer Maternal Grandmother 60  . Colon cancer Maternal Grandmother 6  . Liver cancer Maternal Grandmother   . Hypertension Mother   . Diabetes Mother   . Hypertension Father   . Diabetes Father   . Heart disease Father   . Heart attack Father   . Obesity Father   . Hypertension Sister   . Dementia Maternal Grandfather   . Heart attack Paternal Grandfather      Current Outpatient Medications:  .  b complex vitamins tablet, Take 1 tablet by mouth daily., Disp: , Rfl:  .  lisinopril (ZESTRIL) 10 MG tablet, TAKE 1 TABLET BY MOUTH  DAILY, Disp: 90 tablet, Rfl: 3 .  loratadine (CLARITIN) 10 MG tablet, Take 10 mg by mouth daily., Disp: , Rfl:  .  Multiple Vitamin (MULTIVITAMIN) tablet, Take 1 tablet by mouth every other day. , Disp: , Rfl:  .  OMEPRAZOLE PO, Take 1 tablet by mouth as needed., Disp: , Rfl:    Physical exam:  Vitals:   10/21/19 1329  BP: (!) 160/93  Pulse: 98  Resp: 16  Temp: 98.5 F (36.9 C)  TempSrc: Tympanic  SpO2: 100%  Weight: 229 lb 4.8 oz (104 kg)   Physical Exam Constitutional:      General: She is not in acute distress. Cardiovascular:     Rate and Rhythm: Normal rate and regular rhythm.     Heart sounds: Normal heart sounds.  Pulmonary:     Effort: Pulmonary effort is normal.     Breath sounds: Normal breath sounds.  Abdominal:     General: Bowel sounds are normal.     Palpations: Abdomen is soft.     Comments: No palpable hepatosplenomegaly  Lymphadenopathy:     Comments: No palpable cervical, supraclavicular, axillary or inguinal adenopathy   Skin:    General: Skin is warm and dry.  Neurological:     Mental Status: She is alert and oriented to person,  place, and time.        CMP Latest Ref Rng & Units 09/16/2019  Glucose 65 - 99 mg/dL 86  BUN 6 - 24 mg/dL 9  Creatinine 0.57 - 1.00 mg/dL 0.80  Sodium 134 - 144 mmol/L 138  Potassium 3.5 - 5.2 mmol/L 4.4  Chloride 96 - 106 mmol/L 102  CO2 20 - 29 mmol/L 20  Calcium 8.7 - 10.2 mg/dL 9.6  Total Protein 6.0 - 8.5 g/dL 7.0  Total Bilirubin 0.0 - 1.2 mg/dL 0.4  Alkaline Phos 39 - 117 IU/L 86  AST 0 - 40 IU/L 16  ALT 0 - 32 IU/L 20   CBC Latest Ref Rng & Units 10/07/2019  WBC 3.4 - 10.8 x10E3/uL 14.9(H)  Hemoglobin 11.1 - 15.9  g/dL 15.1  Hematocrit 34.0 - 46.6 % 44.9  Platelets 150 - 450 x10E3/uL 436    Assessment and plan- Patient is a 49 y.o. female referred for leukocytosis mainly neutrophilia and lymphocytosis  Patient had a normal white cell count about 2 years ago and currently she has a mildly elevated white cell count of 14.  Differential mainly shows neutrophilia and lymphocytosis.  I suspect this is reactive.  However I will proceed with a CBC with differential, CMP, flow cytometry, ESR and smear review.  I will see her back in 2 weeks time to discuss the results of her blood work.  L[facility] to [facility] at [facility]   Thank you for this kind referral and the opportunity to participate in the care of this patient   Visit Diagnosis 1. Neutrophilia   2. Lymphocytosis     Dr. Randa Evens, MD, MPH [facility] [phone] 10/24/2019 1:09 PM

## 2019-10-28 ENCOUNTER — Other Ambulatory Visit: Payer: Self-pay

## 2019-10-28 ENCOUNTER — Encounter: Payer: Self-pay | Admitting: Obstetrics and Gynecology

## 2019-10-28 ENCOUNTER — Ambulatory Visit (INDEPENDENT_AMBULATORY_CARE_PROVIDER_SITE_OTHER): Payer: Managed Care, Other (non HMO) | Admitting: Obstetrics and Gynecology

## 2019-10-28 VITALS — BP 120/90 | Ht 61.0 in | Wt 231.0 lb

## 2019-10-28 DIAGNOSIS — Z124 Encounter for screening for malignant neoplasm of cervix: Secondary | ICD-10-CM | POA: Diagnosis not present

## 2019-10-28 DIAGNOSIS — Z1211 Encounter for screening for malignant neoplasm of colon: Secondary | ICD-10-CM

## 2019-10-28 DIAGNOSIS — Z01419 Encounter for gynecological examination (general) (routine) without abnormal findings: Secondary | ICD-10-CM | POA: Diagnosis not present

## 2019-10-28 DIAGNOSIS — Z1231 Encounter for screening mammogram for malignant neoplasm of breast: Secondary | ICD-10-CM | POA: Diagnosis not present

## 2019-10-28 NOTE — Progress Notes (Signed)
Chief Complaint  Patient presents with  . Gynecologic Exam  . [employer] Employee     HPI:      [name] is a 49 y.o. G1P1001 who LMP was Patient's last menstrual period was 10/16/2019 (approximate)., presents today for her annual examination. Her menses are monthly, lasting 4 days, mod flow. Dysmenorrhea none. She does not have intermenstrual bleeding. Was on OCPs for cycle control but had new dx of HTN last yr and put on lisinopril. Tried POPs but cycles irregular, so pt stopped them. Periods ok for now.   Sex activity: not sexually active.  Last Pap: 10/14/18  Results were: no abnormalities /neg HPV DNA 2019.  Pt likes yearly paps.  Hx of STDs: none  Last mammogram: 12/03/18 Results were: normal--routine follow-up in 12 months There is a FH of breast cancer in her MGM and mat grt aunt, genetic testing not indicated. There is no FH of ovarian cancer. The patient does do self-breast exams.  Tobacco use: The patient denies current or previous tobacco use. Alcohol use: social drinker No drug use Exercise: moderately active  Colonoscopy: never; FH colon cancer in MGM.  She does get adequate calcium but not Vitamin D in her diet.  Labs with PCP/work. Being eval for leukocytosis with hematology.  Past Medical History:  Diagnosis Date  . Allergy   . Hypertension   . Menstrual migraine     Past Surgical History:  Procedure Laterality Date  . MOUTH SURGERY    . WISDOM TOOTH EXTRACTION      Family History  Problem Relation Age of Onset  . Breast cancer Maternal Grandmother 60  . Colon cancer Maternal Grandmother 27  . Liver cancer Maternal Grandmother   . Hypertension Mother   . Diabetes Mother   . Hypertension Father   . Diabetes Father   . Heart disease Father   . Heart attack Father   . Obesity Father   . Hypertension Sister   . Dementia Maternal Grandfather   . Heart attack Paternal Grandfather     Social History   Socioeconomic History  .  Marital status: Divorced    Spouse name: Not on file  . Number of children: Not on file  . Years of education: Not on file  . Highest education level: Some college, no degree  Occupational History  . Not on file  Tobacco Use  . Smoking status: Never Smoker  . Smokeless tobacco: Never Used  Vaping Use  . Vaping Use: Never used  Substance and Sexual Activity  . Alcohol use: Yes    Comment: occassionally   . Drug use: No  . Sexual activity: Not Currently    Birth control/protection: None  Other Topics Concern  . Not on file  Social History Narrative  . Not on file   Social Determinants of Health   Financial Resource Strain:   . Difficulty of Paying Living Expenses:   Food Insecurity:   . Worried About Programme researcher, broadcasting/film/video in the Last Year:   . Barista in the Last Year:   Transportation Needs:   . Freight forwarder (Medical):   Marland Kitchen Lack of Transportation (Non-Medical):   Physical Activity:   . Days of Exercise per Week:   . Minutes of Exercise per Session:   Stress:   . Feeling of Stress :   Social Connections:   . Frequency of Communication with Friends and Family:   . Frequency of Social Gatherings  with Friends and Family:   . Attends Religious Services:   . Active Member of Clubs or Organizations:   . Attends Banker Meetings:   Marland Kitchen Marital Status:   Intimate Partner Violence:   . Fear of Current or Ex-Partner:   . Emotionally Abused:   Marland Kitchen Physically Abused:   . Sexually Abused:     Current Outpatient Medications on File Prior to Visit  Medication Sig Dispense Refill  . b complex vitamins tablet Take 1 tablet by mouth daily.    Marland Kitchen lisinopril (ZESTRIL) 10 MG tablet TAKE 1 TABLET BY MOUTH  DAILY 90 tablet 3  . loratadine (CLARITIN) 10 MG tablet Take 10 mg by mouth daily.    . Multiple Vitamin (MULTIVITAMIN) tablet Take 1 tablet by mouth every other day.     Marland Kitchen OMEPRAZOLE PO Take 1 tablet by mouth as needed.     No current  facility-administered medications on file prior to visit.    ROS:  Review of Systems  Constitutional: Negative for fatigue, fever and unexpected weight change.  Respiratory: Negative for cough, shortness of breath and wheezing.   Cardiovascular: Negative for chest pain, palpitations and leg swelling.  Gastrointestinal: Negative for blood in stool, constipation, diarrhea, nausea and vomiting.  Endocrine: Negative for cold intolerance, heat intolerance and polyuria.  Genitourinary: Negative for dyspareunia, dysuria, flank pain, frequency, genital sores, hematuria, menstrual problem, pelvic pain, urgency, vaginal bleeding, vaginal discharge and vaginal pain.  Musculoskeletal: Negative for back pain, joint swelling and myalgias.  Skin: Negative for rash.  Neurological: Negative for dizziness, syncope, light-headedness, numbness and headaches.  Hematological: Negative for adenopathy.  Psychiatric/Behavioral: Negative for agitation, confusion, sleep disturbance and suicidal ideas. The patient is not nervous/anxious.      Objective: BP 120/90   Ht 5\' 1"  (1.549 m)   Wt 231 lb (104.8 kg)   LMP 10/16/2019 (Approximate)   BMI 43.65 kg/m    Physical Exam Constitutional:      Appearance: She is well-developed.  Genitourinary:     Vulva, vagina, uterus, right adnexa and left adnexa normal.     No vulval lesion or tenderness noted.     No vaginal discharge, erythema or tenderness.     No cervical motion tenderness or polyp.     Uterus is not enlarged or tender.     No right or left adnexal mass present.     Right adnexa not tender.     Left adnexa not tender.  Neck:     Thyroid: No thyromegaly.  Cardiovascular:     Rate and Rhythm: Normal rate and regular rhythm.     Heart sounds: Normal heart sounds. No murmur heard.   Pulmonary:     Effort: Pulmonary effort is normal.     Breath sounds: Normal breath sounds.  Chest:     Breasts:        Right: No mass, nipple discharge, skin  change or tenderness.        Left: No mass, nipple discharge, skin change or tenderness.  Abdominal:     Palpations: Abdomen is soft.     Tenderness: There is no abdominal tenderness. There is no guarding.  Musculoskeletal:        General: Normal range of motion.     Cervical back: Normal range of motion.  Neurological:     General: No focal deficit present.     Mental Status: She is alert and oriented to person, place, and time.  Cranial Nerves: No cranial nerve deficit.  Skin:    General: Skin is warm and dry.  Psychiatric:        Mood and Affect: Mood normal.        Behavior: Behavior normal.        Thought Content: Thought content normal.        Judgment: Judgment normal.  Vitals reviewed.     Assessment/Plan: Encounter for annual routine gynecological examination  Cervical cancer screening - Plan: IGP, rfx Aptima HPV ASCU  Encounter for screening mammogram for malignant neoplasm of breast - Plan: MM 3D SCREEN BREAST BILATERAL; pt to sched mammo  Screening for colon cancer - Plan: Cologuard; Not interested in colonoscopy this yr. Will do cologuard. Ref sent.            GYN counsel breast self exam, mammography screening, adequate intake of calcium and vitamin D, diet and exercise     F/U  Return in about 1 year (around 10/27/2020).  Meridian Scherger B. Jamarkis Branam, PA-C 10/28/2019 2:11 PM

## 2019-10-28 NOTE — Patient Instructions (Signed)
I value your feedback and entrusting us with your care. If you get a [facility] patient survey, I would appreciate you taking the time to let us know about your experience today. Thank you! ° °As of April 21, 2019, your lab results will be released to your MyChart immediately, before I even have a chance to see them. Please give me time to review them and contact you if there are any abnormalities. Thank you for your patience.  ° °[facility]: [phone] ° ° ° °

## 2019-11-01 LAB — IGP, RFX APTIMA HPV ASCU

## 2019-11-04 ENCOUNTER — Encounter: Payer: Self-pay | Admitting: Oncology

## 2019-11-04 ENCOUNTER — Inpatient Hospital Stay (HOSPITAL_BASED_OUTPATIENT_CLINIC_OR_DEPARTMENT_OTHER): Payer: Managed Care, Other (non HMO) | Admitting: Oncology

## 2019-11-04 DIAGNOSIS — D729 Disorder of white blood cells, unspecified: Secondary | ICD-10-CM | POA: Diagnosis not present

## 2019-11-04 DIAGNOSIS — D72820 Lymphocytosis (symptomatic): Secondary | ICD-10-CM | POA: Diagnosis not present

## 2019-11-04 NOTE — Progress Notes (Signed)
Here to get labs from [facility]. No concerns

## 2019-11-06 NOTE — Progress Notes (Signed)
I connected with Tina Hurley on 11/06/19 at  1:15 PM EDT by video enabled telemedicine visit and verified that I am speaking with the correct person using two identifiers.   I discussed the limitations, risks, security and privacy concerns of performing an evaluation and management service by telemedicine and the availability of in-person appointments. I also discussed with the patient that there may be a patient responsible charge related to this service. The patient expressed understanding and agreed to proceed.  Other persons participating in the visit and their role in the encounter:  none  Patient's location:  home Provider's location:  work  Risk analyst Complaint:  Discuss results of bloodwork  History of present illness: Patient is a 49 year old female with a past medical history significant for for hypertension and GERD who has been referred to [place] for leukocytosis.  Most recent CBC from 10/07/2019 showed white count of 14.9, H&H of 15.1/24.9 and a platelet count of 436.  Differential mainly shows neutrophilia and lymphocytosis.  Her count 2 weeks prior to that was also elevated at 14.  I have another CBC for her from November 2017 when her white count was normal at 6.4.  Patient currently feels well and denies any recurrent infections or hospitalizations.  Her weight has remained stable.    Results of blood work from [facility] were as follows: CBC showed a white count of 13.2, H&H of 14/41.5 and a platelet count of 376.  Differential showed neutrophilia with an ANC of 7.6 and lymphocytosis with an absolute lymphocyte count of 4.6.  CMP was within normal limits.  Peripheral flow cytometry did not show any immunophenotypic abnormality.  Smear review showed mild neutrophilia and lymphocytosis.  Polymorphous population of lymphocytes is identified findings can be seen in acute/chronic infection or inflammatory state.   Interval history no acute issues since last visit.    Review of Systems   Constitutional: Negative for chills, fever, malaise/fatigue and weight loss.  HENT: Negative for congestion, ear discharge and nosebleeds.   Eyes: Negative for blurred vision.  Respiratory: Negative for cough, hemoptysis, sputum production, shortness of breath and wheezing.   Cardiovascular: Negative for chest pain, palpitations, orthopnea and claudication.  Gastrointestinal: Negative for abdominal pain, blood in stool, constipation, diarrhea, heartburn, melena, nausea and vomiting.  Genitourinary: Negative for dysuria, flank pain, frequency, hematuria and urgency.  Musculoskeletal: Negative for back pain, joint pain and myalgias.  Skin: Negative for rash.  Neurological: Negative for dizziness, tingling, focal weakness, seizures, weakness and headaches.  Endo/Heme/Allergies: Does not bruise/bleed easily.  Psychiatric/Behavioral: Negative for depression and suicidal ideas. The patient does not have insomnia.     No Known Allergies  Past Medical History:  Diagnosis Date  . Allergy   . Hypertension   . Menstrual migraine     Past Surgical History:  Procedure Laterality Date  . MOUTH SURGERY    . WISDOM TOOTH EXTRACTION      Social History   Socioeconomic History  . Marital status: Divorced    Spouse name: Not on file  . Number of children: Not on file  . Years of education: Not on file  . Highest education level: Some college, no degree  Occupational History  . Not on file  Tobacco Use  . Smoking status: Never Smoker  . Smokeless tobacco: Never Used  Vaping Use  . Vaping Use: Never used  Substance and Sexual Activity  . Alcohol use: Yes    Comment: occassionally   . Drug use: No  . Sexual  activity: Not Currently    Birth control/protection: None  Other Topics Concern  . Not on file  Social History Narrative  . Not on file   Social Determinants of Health   Financial Resource Strain:   . Difficulty of Paying Living Expenses:   Food Insecurity:   . Worried  About Charity fundraiser in the Last Year:   . Arboriculturist in the Last Year:   Transportation Needs:   . Film/video editor (Medical):   Marland Kitchen Lack of Transportation (Non-Medical):   Physical Activity:   . Days of Exercise per Week:   . Minutes of Exercise per Session:   Stress:   . Feeling of Stress :   Social Connections:   . Frequency of Communication with Friends and Family:   . Frequency of Social Gatherings with Friends and Family:   . Attends Religious Services:   . Active Member of Clubs or Organizations:   . Attends Archivist Meetings:   Marland Kitchen Marital Status:   Intimate Partner Violence:   . Fear of Current or Ex-Partner:   . Emotionally Abused:   Marland Kitchen Physically Abused:   . Sexually Abused:     Family History  Problem Relation Age of Onset  . Breast cancer Maternal Grandmother 60  . Colon cancer Maternal Grandmother 2  . Liver cancer Maternal Grandmother   . Hypertension Mother   . Diabetes Mother   . Hypertension Father   . Diabetes Father   . Heart disease Father   . Heart attack Father   . Obesity Father   . Hypertension Sister   . Dementia Maternal Grandfather   . Heart attack Paternal Grandfather      Current Outpatient Medications:  .  b complex vitamins tablet, Take 1 tablet by mouth daily., Disp: , Rfl:  .  lisinopril (ZESTRIL) 10 MG tablet, TAKE 1 TABLET BY MOUTH  DAILY, Disp: 90 tablet, Rfl: 3 .  loratadine (CLARITIN) 10 MG tablet, Take 10 mg by mouth daily as needed. , Disp: , Rfl:  .  Multiple Vitamin (MULTIVITAMIN) tablet, Take 1 tablet by mouth every other day. , Disp: , Rfl:  .  OMEPRAZOLE PO, Take 10 mg by mouth as needed. , Disp: , Rfl:   No results found.  No images are attached to the encounter.   CMP Latest Ref Rng & Units 09/16/2019  Glucose 65 - 99 mg/dL 86  BUN 6 - 24 mg/dL 9  Creatinine 0.57 - 1.00 mg/dL 0.80  Sodium 134 - 144 mmol/L 138  Potassium 3.5 - 5.2 mmol/L 4.4  Chloride 96 - 106 mmol/L 102  CO2 20 - 29  mmol/L 20  Calcium 8.7 - 10.2 mg/dL 9.6  Total Protein 6.0 - 8.5 g/dL 7.0  Total Bilirubin 0.0 - 1.2 mg/dL 0.4  Alkaline Phos 39 - 117 IU/L 86  AST 0 - 40 IU/L 16  ALT 0 - 32 IU/L 20   CBC Latest Ref Rng & Units 10/07/2019  WBC 3.4 - 10.8 x10E3/uL 14.9(H)  Hemoglobin 11.1 - 15.9 g/dL 15.1  Hematocrit 34.0 - 46.6 % 44.9  Platelets 150 - 450 x10E3/uL 436     Observation/objective: Appears in no acute distress over video visit today.  Breathing is nonlabored  Assessment and plan: Patient is a 49 year old female who was referred for leukocytosis mainly neutrophilia and lymphocytosis  Patient has mild leukocytosis with a white count of 14.  Differential mainly shows neutrophilia and lymphocytosis.  Peripheral flow  cytometry was negative for any immunophenotypic abnormality.  At this time I am inclined to monitor this conservatively without the need for bone marrow biopsy.  If there is a consistent increase in her white cell count I will consider that at that time.  CBC with differential in 3 months in 6 months and I will see her in 6 months.  [facility]abs will [facility] at [facility]  Follow-up instructions:as above  I discussed the assessment and treatment plan with the patient. The patient was provided an opportunity to ask questions and all were answered. The patient agreed with the plan and demonstrated an understanding of the instructions.   The patient was advised to call back or seek an in-person evaluation if the symptoms worsen or if the condition fails to improve as anticipated.    Visit Diagnosis: 1. Neutrophilia   2. Lymphocytosis     Dr. Randa Evens, MD, MPH [facility] at [facility] Tel- [phone] 11/06/2019 11:29 AM

## 2019-11-07 ENCOUNTER — Encounter: Payer: Self-pay | Admitting: Oncology

## 2019-12-09 ENCOUNTER — Ambulatory Visit
Admission: RE | Admit: 2019-12-09 | Discharge: 2019-12-09 | Disposition: A | Discharge status: Home / Self Care | Payer: Managed Care, Other (non HMO) | Source: Ambulatory Visit | Attending: Obstetrics and Gynecology | Admitting: Obstetrics and Gynecology

## 2019-12-09 ENCOUNTER — Other Ambulatory Visit: Payer: Self-pay

## 2019-12-09 DIAGNOSIS — Z1231 Encounter for screening mammogram for malignant neoplasm of breast: Secondary | ICD-10-CM | POA: Insufficient documentation

## 2019-12-13 ENCOUNTER — Encounter: Payer: Self-pay | Admitting: Obstetrics and Gynecology

## 2020-01-02 ENCOUNTER — Telehealth: Payer: Self-pay

## 2020-01-02 NOTE — Telephone Encounter (Signed)
Per [facility], called pt to follow up on test. Pt says she has the kit just hasn't done it, plans on completing it.

## 2020-04-04 ENCOUNTER — Other Ambulatory Visit: Payer: Self-pay | Admitting: Family Medicine

## 2020-04-04 DIAGNOSIS — I10 Essential (primary) hypertension: Secondary | ICD-10-CM

## 2020-04-04 NOTE — Telephone Encounter (Signed)
Requested medications are due for refill today yes (mail order)  Requested medications are on the active medication list yes  Last refill 9/30  Last visit 09/2018  Future visit scheduled no  Notes to clinic Failed protocol of visit within 6 months, no upcoming visit scheduled.

## 2020-04-20 ENCOUNTER — Other Ambulatory Visit: Payer: Self-pay | Admitting: Family Medicine

## 2020-04-20 DIAGNOSIS — I10 Essential (primary) hypertension: Secondary | ICD-10-CM

## 2020-04-20 NOTE — Telephone Encounter (Signed)
Requested medications are due for refill today yes  Requested medications are on the active medication list yes  Last refill 9/30  Last visit 18 months ago 09/2018  Future visit scheduled no  Notes to clinic already been refused once, still no visit scheduled.

## 2020-04-30 ENCOUNTER — Inpatient Hospital Stay: Payer: Managed Care, Other (non HMO) | Admitting: Oncology

## 2020-04-30 ENCOUNTER — Encounter: Payer: Self-pay | Admitting: Oncology

## 2020-05-07 ENCOUNTER — Inpatient Hospital Stay: Payer: Managed Care, Other (non HMO) | Admitting: Oncology

## 2020-05-09 ENCOUNTER — Encounter: Payer: Self-pay | Admitting: Oncology

## 2020-05-09 ENCOUNTER — Inpatient Hospital Stay: Payer: Managed Care, Other (non HMO) | Attending: Oncology | Admitting: Oncology

## 2020-05-09 DIAGNOSIS — D729 Disorder of white blood cells, unspecified: Secondary | ICD-10-CM | POA: Diagnosis not present

## 2020-05-09 DIAGNOSIS — D72820 Lymphocytosis (symptomatic): Secondary | ICD-10-CM

## 2020-05-09 NOTE — Progress Notes (Signed)
No concerns. 

## 2020-05-13 NOTE — Progress Notes (Signed)
I connected with Tina Hurley on 05/13/20 at 10:30 AM EST by video enabled telemedicine visit and verified that I am speaking with the correct person using two identifiers.   I discussed the limitations, risks, security and privacy concerns of performing an evaluation and management service by telemedicine and the availability of in-person appointments. I also discussed with the patient that there may be a patient responsible charge related to this service. The patient expressed understanding and agreed to proceed.  Other persons participating in the visit and their role in the encounter:  none  Patient's location:  home Provider's location:  home  Chief Complaint:  Routine f/u of neutrophilia  History of present illness: Patient is a 50 year old female with a past medical history significant for for hypertension and GERD who has been referred to [place] for leukocytosis. Most recent CBC from 10/07/2019 showed white count of 14.9, H&H of 15.1/24.9 and a platelet count of 436. Differential mainly shows neutrophilia and lymphocytosis. Her count 2 weeks prior to that was also elevated at 14. I have another CBC for her from November 2017 when her white count was normal at 6.4. Patient currently feels well and denies any recurrent infections or hospitalizations. Her weight has remained stable.   Results of blood work from [facility] were as follows: CBC showed a white count of 13.2, H&H of 14/41.5 and a platelet count of 376.  Differential showed neutrophilia with an ANC of 7.6 and lymphocytosis with an absolute lymphocyte count of 4.6.  CMP was within normal limits.  Peripheral flow cytometry did not show any immunophenotypic abnormality.  Smear review showed mild neutrophilia and lymphocytosis.  Polymorphous population of lymphocytes is identified findings can be seen in acute/chronic infection or inflammatory state.   Interval history : Patient is doing well and denies any recurrent infections or  sickness   Review of Systems  Constitutional: Negative for chills, fever, malaise/fatigue and weight loss.  HENT: Negative for congestion, ear discharge and nosebleeds.   Eyes: Negative for blurred vision.  Respiratory: Negative for cough, hemoptysis, sputum production, shortness of breath and wheezing.   Cardiovascular: Negative for chest pain, palpitations, orthopnea and claudication.  Gastrointestinal: Negative for abdominal pain, blood in stool, constipation, diarrhea, heartburn, melena, nausea and vomiting.  Genitourinary: Negative for dysuria, flank pain, frequency, hematuria and urgency.  Musculoskeletal: Negative for back pain, joint pain and myalgias.  Skin: Negative for rash.  Neurological: Negative for dizziness, tingling, focal weakness, seizures, weakness and headaches.  Endo/Heme/Allergies: Does not bruise/bleed easily.  Psychiatric/Behavioral: Negative for depression and suicidal ideas. The patient does not have insomnia.     No Known Allergies  Past Medical History:  Diagnosis Date  . Allergy   . Hypertension   . Menstrual migraine     Past Surgical History:  Procedure Laterality Date  . MOUTH SURGERY    . WISDOM TOOTH EXTRACTION      Social History   Socioeconomic History  . Marital status: Divorced    Spouse name: Not on file  . Number of children: Not on file  . Years of education: Not on file  . Highest education level: Some college, no degree  Occupational History  . Not on file  Tobacco Use  . Smoking status: Never Smoker  . Smokeless tobacco: Never Used  Vaping Use  . Vaping Use: Never used  Substance and Sexual Activity  . Alcohol use: Yes    Comment: occassionally   . Drug use: No  . Sexual activity: Not Currently  Birth control/protection: None  Other Topics Concern  . Not on file  Social History Narrative  . Not on file   Social Determinants of Health   Financial Resource Strain: Not on file  Food Insecurity: Not on file   Transportation Needs: Not on file  Physical Activity: Not on file  Stress: Not on file  Social Connections: Not on file  Intimate Partner Violence: Not on file    Family History  Problem Relation Age of Onset  . Breast cancer Maternal Grandmother 60  . Colon cancer Maternal Grandmother 5  . Liver cancer Maternal Grandmother   . Hypertension Mother   . Diabetes Mother   . Hypertension Father   . Diabetes Father   . Heart disease Father   . Heart attack Father   . Obesity Father   . Hypertension Sister   . Dementia Maternal Grandfather   . Heart attack Paternal Grandfather      Current Outpatient Medications:  .  b complex vitamins tablet, Take 1 tablet by mouth daily., Disp: , Rfl:  .  lisinopril (ZESTRIL) 10 MG tablet, TAKE 1 TABLET BY MOUTH  DAILY, Disp: 90 tablet, Rfl: 3 .  loratadine (CLARITIN) 10 MG tablet, Take 10 mg by mouth daily as needed. , Disp: , Rfl:  .  Multiple Vitamin (MULTIVITAMIN) tablet, Take 1 tablet by mouth every other day. , Disp: , Rfl:  .  OMEPRAZOLE PO, Take 10 mg by mouth as needed. , Disp: , Rfl:   No results found.  No images are attached to the encounter.   CMP Latest Ref Rng & Units 09/16/2019  Glucose 65 - 99 mg/dL 86  BUN 6 - 24 mg/dL 9  Creatinine 0.57 - 1.00 mg/dL 0.80  Sodium 134 - 144 mmol/L 138  Potassium 3.5 - 5.2 mmol/L 4.4  Chloride 96 - 106 mmol/L 102  CO2 20 - 29 mmol/L 20  Calcium 8.7 - 10.2 mg/dL 9.6  Total Protein 6.0 - 8.5 g/dL 7.0  Total Bilirubin 0.0 - 1.2 mg/dL 0.4  Alkaline Phos 39 - 117 IU/L 86  AST 0 - 40 IU/L 16  ALT 0 - 32 IU/L 20   CBC Latest Ref Rng & Units 10/07/2019  WBC 3.4 - 10.8 x10E3/uL 14.9(H)  Hemoglobin 11.1 - 15.9 g/dL 15.1  Hematocrit 34.0 - 46.6 % 44.9  Platelets 150 - 450 x10E3/uL 436     Observation/objective:appears in no acute distress over video visit today.   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 showed white cell count of 12.3 with an H&H of 13.3/38.9 and a platelet count of 372.  Differential mainly showed neutrophilia and lymphocytosis which has been chronic and overall stable.  Peripheral flow cytometry in the past has been negative for lymphoma or leukemia.  Patient does not require any further bone marrow biopsy at this time.  I will see her back in 1 year with a repeat CBC with differential  Follow-up instructions: as above  I discussed the assessment and treatment plan with the patient. The patient was provided an opportunity to ask questions and all were answered. The patient agreed with the plan and demonstrated an understanding of the instructions.   The patient was advised to call back or seek an in-person evaluation if the symptoms worsen or if the condition fails to improve as anticipated.    Visit Diagnosis: 1. Neutrophilia  2. Lymphocytosis     Dr. Randa Evens, MD, MPH [facility] at [facility] Tel- [phone] 05/13/2020 7:27 PM

## 2020-06-17 IMAGING — MG DIGITAL SCREENING BILATERAL MAMMOGRAM WITH TOMO AND CAD
8 series · 8 of 24 positions shown · non-contrast
Comparison: Previous exam(s).

CLINICAL DATA: Screening.

EXAM:
DIGITAL SCREENING BILATERAL MAMMOGRAM WITH TOMO AND CAD

[L CC synth-2D]
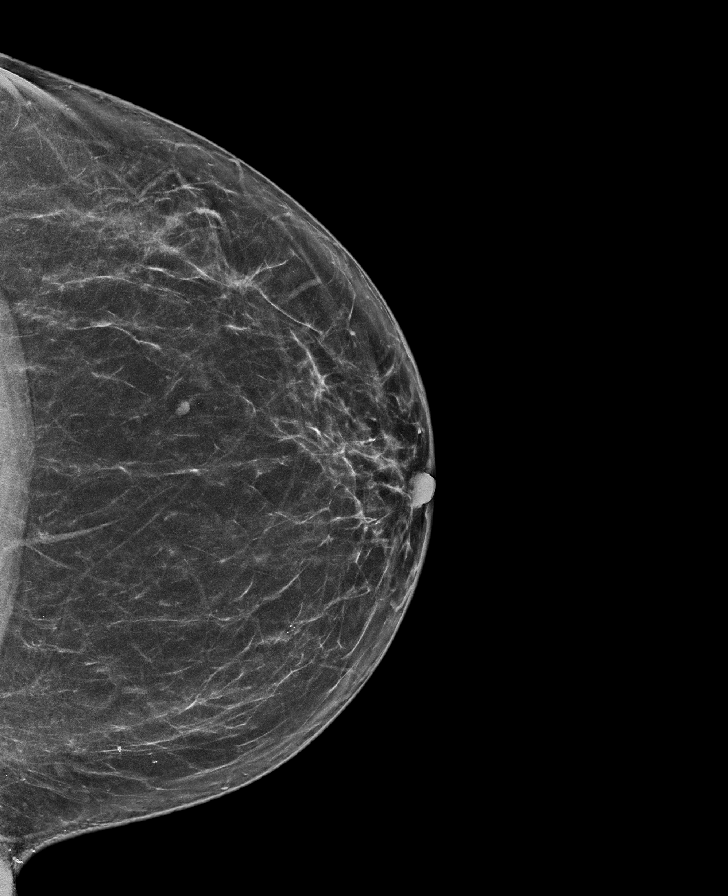

[L MLO synth-2D]
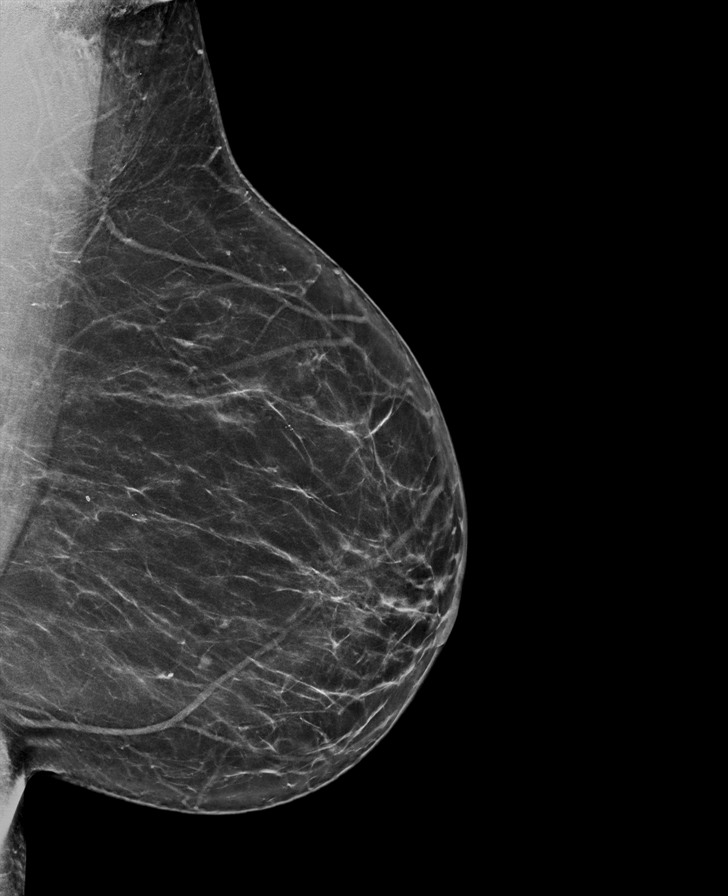

[R MLO synth-2D]
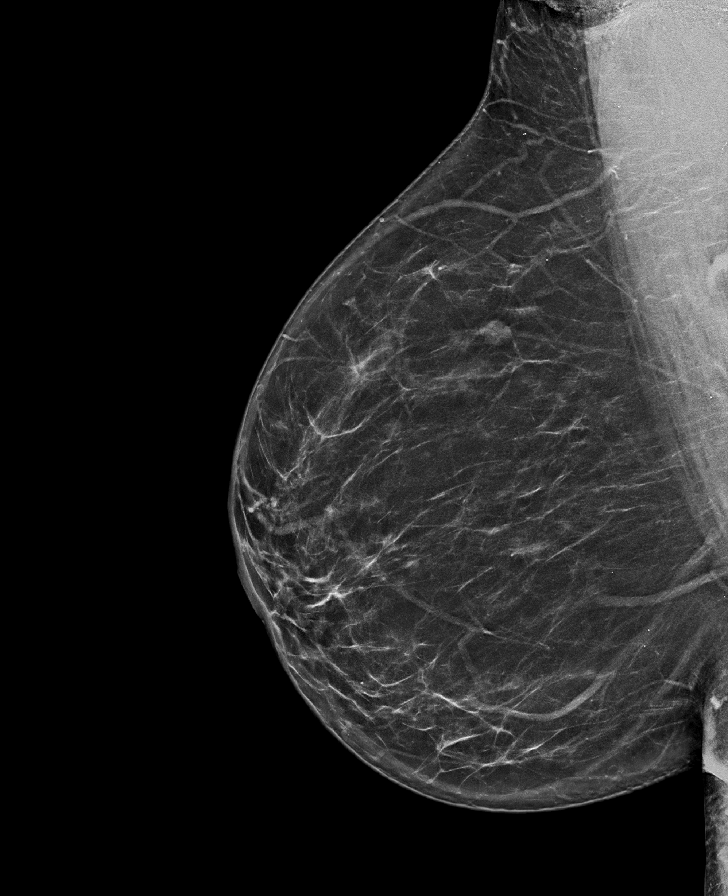

[R CC synth-2D]
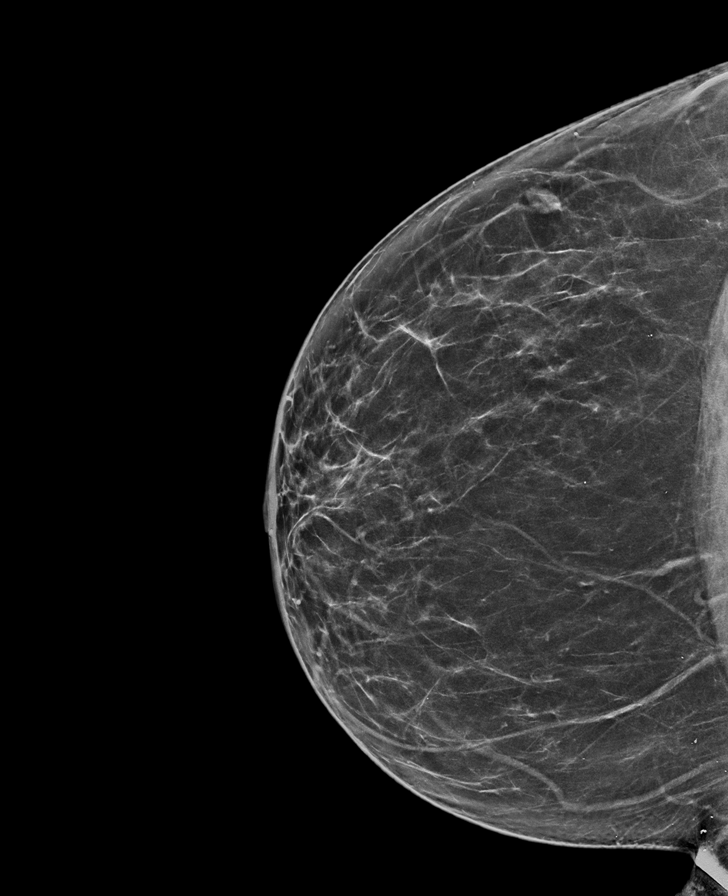

[L MLO tomo · tomo slice 40/79.0]
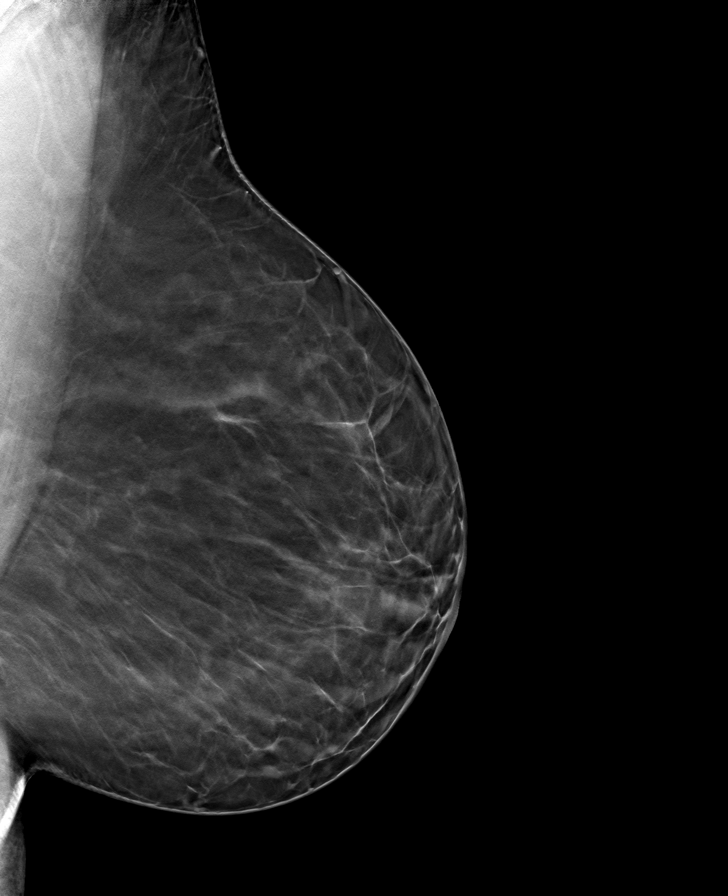

[R CC tomo · tomo slice 37/73.0]
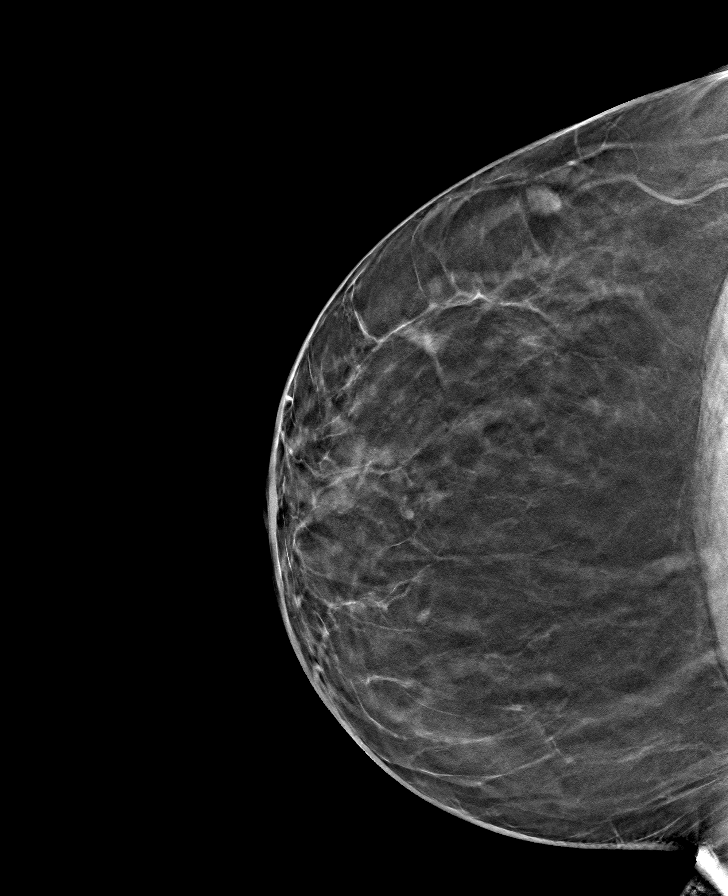

[R MLO tomo · tomo slice 41/80.0]
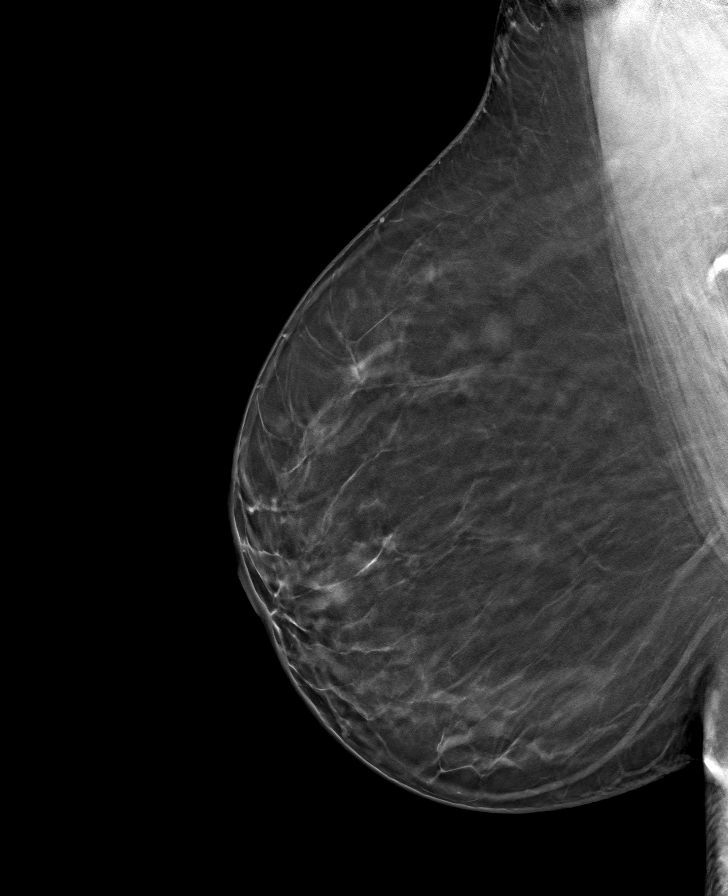

[L CC tomo · tomo slice 37/72.0]
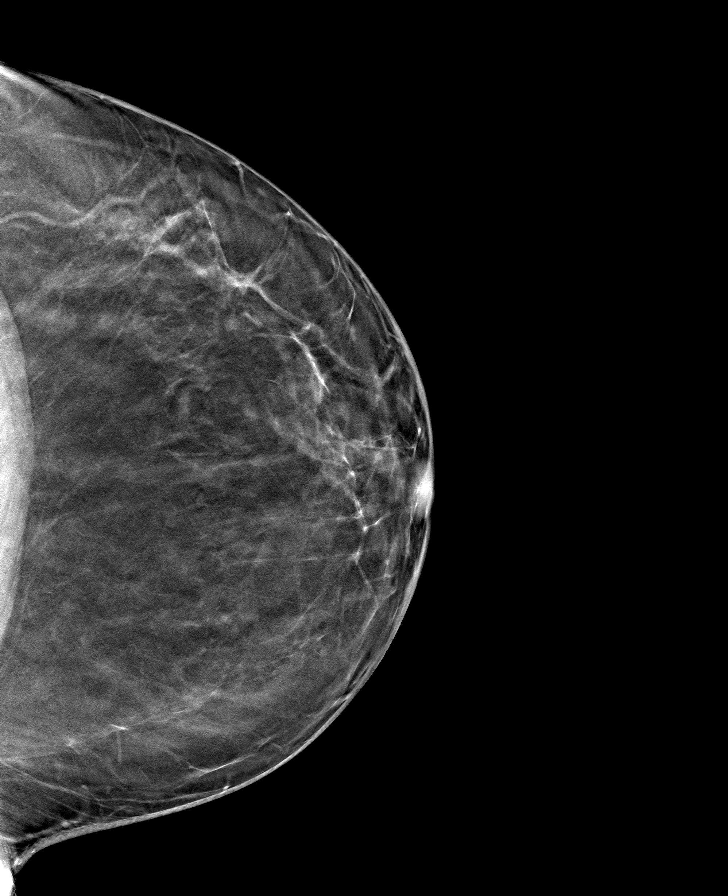

[8 of 24 positions shown; findings below may reference images not displayed]

ACR Breast Density Category b: There are scattered areas of
fibroglandular density.
FINDINGS: There are no findings suspicious for malignancy. Images were
processed with CAD.
IMPRESSION: No mammographic evidence of malignancy. A result letter of this
screening mammogram will be mailed directly to the patient.

RECOMMENDATION:
Screening mammogram in one year. (Code:[id number])

BI-RADS CATEGORY  1: Negative.

## 2020-06-25 ENCOUNTER — Telehealth: Payer: Self-pay

## 2020-06-25 NOTE — Telephone Encounter (Signed)
I contacted the patient and schedule an appt with Dr. Kirtland Bouchard on 07/13/20 at 1:40pm to discuss her knee pain.

## 2020-06-25 NOTE — Telephone Encounter (Signed)
Copied from CRM #[id number]. Topic: General - Other >> Jun 22, 2020 10:36 AM Williams, Olivia wrote: Reason for CRM: Pt calling stating that she is having knee pain. She states that she is looking for an appt on Friday after 11. Please advise. 

## 2020-06-25 NOTE — Telephone Encounter (Signed)
Copied from CRM [id number]. Topic: General - Other >> Jun 22, 2020 10:36 AM Gwenlyn Fudge wrote: Reason for CRM: Pt calling stating that she is having knee pain. She states that she is looking for an appt on Friday after 11. Please advise.

## 2020-07-13 ENCOUNTER — Encounter: Payer: Self-pay | Admitting: Family Medicine

## 2020-07-13 ENCOUNTER — Other Ambulatory Visit: Payer: Self-pay

## 2020-07-13 ENCOUNTER — Ambulatory Visit (INDEPENDENT_AMBULATORY_CARE_PROVIDER_SITE_OTHER): Payer: Managed Care, Other (non HMO) | Admitting: Family Medicine

## 2020-07-13 ENCOUNTER — Other Ambulatory Visit: Payer: Self-pay | Admitting: Family Medicine

## 2020-07-13 VITALS — BP 145/77 | HR 92 | Ht 61.5 in | Wt 215.2 lb

## 2020-07-13 DIAGNOSIS — I10 Essential (primary) hypertension: Secondary | ICD-10-CM

## 2020-07-13 DIAGNOSIS — M25562 Pain in left knee: Secondary | ICD-10-CM | POA: Diagnosis not present

## 2020-07-13 DIAGNOSIS — G8929 Other chronic pain: Secondary | ICD-10-CM | POA: Diagnosis not present

## 2020-07-13 MED ORDER — LISINOPRIL 10 MG PO TABS: 10.0000 mg | ORAL_TABLET | Freq: Every day | ORAL | 1 refills | Status: DC

## 2020-07-13 NOTE — Patient Instructions (Addendum)
Thank you for coming to the office today.  Voltaren topical OTC 1-4 times a day as needed, anti inflammatory  You most likely have a knee sprain - this is an injury to the ligaments supporting the knee, it can be a minor injury or small tear, rarely it can turn out to be a larger tear to the ligament if it does not improve, we cannot rule this out.  Do NOT think it is the meniscus injury (the cartilage shock absorber within the knee)  No X-ray today we can reconsider in future  - I recommend a Knee Brace to support your knee and limit motion of the knee to help it heal, avoid excess weight bearing and repetitive activities on it  Recommend trial of Anti-inflammatory with trial with TOPICAL regimen Voltaren diclofenac 1-4 times a day as needed.  If you keep with Ibuprofen - take up to 3 pills at once max is 8 hours or 3 times a day. One or other other! - May try OTC Aleve 250 x 1 to 2 pills - take one with food and plenty of water once or twice a day every 12 hours for 1-2 weeks - DO NOT TAKE any advil, aleve, motrin while you are taking this medicine  - It is safe to take Tylenol Ext Str 500mg  tabs - take 1 to 2 (max dose 1000mg ) every 6 hours as needed for breakthrough pain, max 24 hour daily dose is 6 to 8 tablets or 4000mg   Use RICE therapy: - R - Rest / relative rest with activity modification avoid overuse and frequent bending or pressure on bent knee - I - Ice packs (make sure you use a towel or sock / something to protect skin) - C - Compression with ACE wrap or immobilizer apply pressure and reduce swelling allowing more support - E - Elevation - if significant swelling, lift leg above heart level (toes above your nose) to help reduce swelling, most helpful at night after day of being on your feet  If not improving within 2 weeks I would recommend follow-up we can do refer to PT / or Ortho or X-ray   Please schedule a Follow-up Appointment to: Return in about 2 weeks (around  07/27/2020), or if symptoms worsen or fail to improve, for knee pain if not improved.  If you have any other questions or concerns, please feel free to call the office or send a message through MyChart. You may also schedule an earlier appointment if necessary.  Additionally, you may be receiving a survey about your experience at our office within a few days to 1 week by e-mail or mail. We value your feedback.  , DO [facility], [facility]    You can stretch your leg right away by doing the first 3 stretching exercises. Start strengthening your leg by doing the last 3 exercises.  Hamstring stretch on wall: Lie on your back with your buttocks close to a doorway, and extend your legs straight out in front of you along the floor. Raise the injured leg and rest it against the wall next to the door frame. Your other leg should extend through the doorway. You should feel a stretch in the back of your thigh. Hold this position for 15 to 30 seconds. Repeat 3 times.  Standing calf stretch: Facing a wall, put your hands against the wall at about eye level. Keep the injured leg back, the uninjured leg forward, and the heel of your  injured leg on the floor. Turn your injured foot slightly inward (as if you were pigeon-toed) as you slowly lean into the wall until you feel a stretch in the back of your calf. Hold for 15 to 30 seconds. Repeat 3 times. Do this exercise several times each day.  Quadriceps stretch: Stand an arm's length away from the wall, facing straight ahead. Brace yourself by keeping the hand on the uninjured side against the wall. With your other hand, grasp the ankle of the injured leg and pull your heel toward your buttocks. Don't arch or twist your back and keep your knees together. Hold this stretch for 15 to 30 seconds. Repeat 3 times.  Quadriceps isometrics: Sitting on the floor with your injured leg straight and your other leg bent, press the back of your  knee into the floor by tightening the muscles on the top of your thigh. Hold this position 10 seconds. Relax. Do 3 sets of 10.  Heel slide: Sit on a firm surface with your legs straight in front of you. Slowly slide the heel of your injured leg toward your buttock by pulling your knee to your chest as you slide. Return to the starting position. Do 3 sets of 10.  Hamstring isometrics: Sitting on the floor with the injured leg slightly bent, dig the heel of your injured leg into the floor and tighten up the back of your thigh muscles. Hold this position for 5 seconds. Do 3 sets of 10.

## 2020-07-13 NOTE — Progress Notes (Signed)
Subjective:    Patient ID: Tina Hurley, female    DOB: 12-27-1970, 50 y.o.   MRN: [id number]  Tina Hurley is a 50 y.o. female presenting on 07/13/2020 for Knee Pain   HPI   LEFT Knee Pain, chronic - Describes new onset problem Left knee pain few months ago, gradual onset pain without acute sudden start of pain, she says at rest no pain, but walking not much pain, pain in left knee more lateral and it does radiate up into leg at times. - She is able to play Pickleball regularly - does running, lunging, bending etc. 2 days a week. - still able to play without significant interference. 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 - Taking Ibuprofen 200mg  x 2 - about twice a week before playing Pickleball. - Tried biofreeze limited relief, thought about knee sleeve has not used No prior L knee injury. 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 Denies any mechanical locking or knee getting stuck Denies any fall or trauma, or twisting injury, bruising, redness, swelling.   Depression screen [facility] 2/9 09/30/2019 02/05/2018  Decreased Interest 0 0  Down, Depressed, Hopeless - 0  PHQ - 2 Score 0 0    Social History   Tobacco Use  . Smoking status: Never Smoker  . Smokeless tobacco: Never Used  Vaping Use  . Vaping Use: Never used  Substance Use Topics  . Alcohol use: Yes    Comment: occassionally   . Drug use: No    Review of Systems Per HPI unless specifically indicated above     Objective:    BP (!) 145/77   Pulse 92   Ht 5' 1.5" (1.562 m)   Wt 215 lb 3.2 oz (97.6 kg)   SpO2 99%   BMI 40.00 kg/m   Wt Readings from Last 3 Encounters:  07/13/20 215 lb 3.2 oz (97.6 kg)  10/28/19 231 lb (104.8 kg)  10/21/19 229 lb 4.8 oz (104 kg)    Physical Exam Vitals and nursing note reviewed.  Constitutional:      General: She is not in acute distress.     Appearance: She is well-developed and well-nourished. She is not diaphoretic.     Comments: Well-appearing, comfortable, cooperative  HENT:     Head: Normocephalic and atraumatic.     Mouth/Throat:     Mouth: Oropharynx is clear and moist.  Eyes:     General:        Right eye: No discharge.        Left eye: No discharge.     Conjunctiva/sclera: Conjunctivae normal.  Cardiovascular:     Rate and Rhythm: Normal rate.  Pulmonary:     Effort: Pulmonary effort is normal.  Musculoskeletal:        General: No edema.     Comments: Left Knee Inspection: Normal appearance and symmetrical. No ecchymosis or effusion. Palpation: Non-tender to palpation. No crepitus ROM: Full active ROM bilaterally Special Testing: Lachman / Valgus/Varus tests negative with intact ligaments (ACL, MCL, LCL). McMurray negative without meniscus symptoms. Standing Thessaly without provoked meniscus pain. Strength: 5/5 intact knee flex/ext, ankle dorsi/plantarflex Neurovascular: distally intact sensation light touch and pulses   Skin:    General: Skin is warm and dry.     Findings: No erythema or rash.  Neurological:     Mental Status: She  is alert and oriented to person, place, and time.  Psychiatric:        Mood and Affect: Mood and affect normal.        Behavior: Behavior normal.     Comments: Well groomed, good eye contact, normal speech and thoughts    Results for orders placed or performed in visit on 10/28/19  IGP, rfx Aptima HPV ASCU  Result Value Ref Range   Interpretation NILM    Category NIL    Adequacy SECNI    Clinician Provided ICD10 Comment    Performed by: Comment    Note: Comment    Test Methodology Comment    PAP Reflex Comment       Assessment & Plan:   Problem List Items Addressed This Visit   None   Visit Diagnoses    Chronic pain of left knee    -  Primary      Chronic L superior vs generalized Knee pain without swelling and no known injury Mostly suggestive of overuse or  repetitive strain injury Ligaments and meniscus intact no sign of tear or structural injury - Able to bear weight, no knee instability and no mechanical locking - No prior history of knee surgery, arthroscopy - Inadequate conservative therapy   Plan: 1. Start anti-inflammatory regular dosing w/ oral Ibuprofen vs OTC Aleve 2. May use topical NSAID Voltaren, coupon given 3. RICE therapy (rest, ice, compression, elevation) for swelling, activity modification - Knee sleeve / support device advised. 4. Hold imaging unlikely fracture based on mechanism 5. Follow-up 2 week PRN if not improved we can refer to Ortho vs X-ray vs PT depending on how she is doing.   No orders of the defined types were placed in this encounter.     Follow up plan: Return in about 2 weeks (around 07/27/2020), or if symptoms worsen or fail to improve, for knee pain if not improved.  Saralyn Pilar, DO [facility] 07/13/2020, 1:50 PM

## 2020-09-14 ENCOUNTER — Other Ambulatory Visit: Payer: Self-pay

## 2020-09-14 ENCOUNTER — Encounter: Payer: Self-pay | Admitting: Internal Medicine

## 2020-09-14 ENCOUNTER — Ambulatory Visit: Payer: Managed Care, Other (non HMO) | Admitting: Internal Medicine

## 2020-09-14 VITALS — BP 133/82 | HR 67 | Temp 97.1°F | Resp 18 | Ht 61.5 in | Wt 216.4 lb

## 2020-09-14 DIAGNOSIS — M25562 Pain in left knee: Secondary | ICD-10-CM | POA: Diagnosis not present

## 2020-09-14 DIAGNOSIS — G8929 Other chronic pain: Secondary | ICD-10-CM

## 2020-09-14 DIAGNOSIS — K219 Gastro-esophageal reflux disease without esophagitis: Secondary | ICD-10-CM | POA: Diagnosis not present

## 2020-09-14 DIAGNOSIS — I10 Essential (primary) hypertension: Secondary | ICD-10-CM | POA: Diagnosis not present

## 2020-09-14 DIAGNOSIS — E782 Mixed hyperlipidemia: Secondary | ICD-10-CM

## 2020-09-14 NOTE — Patient Instructions (Signed)

## 2020-09-14 NOTE — Progress Notes (Signed)
Subjective:    Patient ID: Tina Hurley, female    DOB: Feb 25, 1971, 50 y.o.   MRN: [id number]  HPI  Pt presents to the clinic today for follow up of chronic conditions.  She is establishing care with me today, transferring care from [facility], NP.  HTN: Her BP today is 133/82. She is taking Lisinopril as prescribed. There is no ECG on file.  HLD: Her last LDL was 101, triglycerides 209, 09/2019. She is not taking any cholesterol lowering medication at this time.   GERD. Triggered by eating too fast, tomato based foods. She takes Omeprazole as needed. There is no upper GI on file.  Left Knee Pain: She reports this started 4 to 5 months ago.  She does have difficulty with weightbearing and has been on able to play pickle ball.  She has seen an orthopedist for the same.  She has a MRI scheduled.  She has not done any physical therapy.  She has tried Ibuprofen intermittently but does not take this consistently.  Review of Systems  Past Medical History:  Diagnosis Date  . Allergy   . Hypertension   . Menstrual migraine     Current Outpatient Medications  Medication Sig Dispense Refill  . b complex vitamins tablet Take 1 tablet by mouth daily.    Marland Kitchen lisinopril (ZESTRIL) 10 MG tablet Take 1 tablet (10 mg total) by mouth daily. 90 tablet 1  . loratadine (CLARITIN) 10 MG tablet Take 10 mg by mouth daily as needed.     . Multiple Vitamin (MULTIVITAMIN) tablet Take 1 tablet by mouth every other day.     Marland Kitchen OMEPRAZOLE PO Take 10 mg by mouth as needed.      No current facility-administered medications for this visit.    No Known Allergies  Family History  Problem Relation Age of Onset  . Breast cancer Maternal Grandmother 60  . Colon cancer Maternal Grandmother 33  . Liver cancer Maternal Grandmother   . Hypertension Mother   . Diabetes Mother   . Hypertension Father   . Diabetes Father   . Heart disease Father   . Heart attack Father   . Obesity Father   . Hypertension  Sister   . Dementia Maternal Grandfather   . Heart attack Paternal Grandfather     Social History   Socioeconomic History  . Marital status: Divorced    Spouse name: Not on file  . Number of children: Not on file  . Years of education: Not on file  . Highest education level: Some college, no degree  Occupational History  . Not on file  Tobacco Use  . Smoking status: Never Smoker  . Smokeless tobacco: Never Used  Vaping Use  . Vaping Use: Never used  Substance and Sexual Activity  . Alcohol use: Yes    Comment: occassionally   . Drug use: No  . Sexual activity: Not Currently    Birth control/protection: None  Other Topics Concern  . Not on file  Social History Narrative  . Not on file   Social Determinants of Health   Financial Resource Strain: Not on file  Food Insecurity: Not on file  Transportation Needs: Not on file  Physical Activity: Not on file  Stress: Not on file  Social Connections: Not on file  Intimate Partner Violence: Not on file     Constitutional: Denies fever, malaise, fatigue, headache or abrupt weight changes.  HEENT: Denies eye pain, eye redness, ear pain, ringing  in the ears, wax buildup, runny nose, nasal congestion, bloody nose, or sore throat. Respiratory: Denies difficulty breathing, shortness of breath, cough or sputum production.   Cardiovascular: Denies chest pain, chest tightness, palpitations or swelling in the hands or feet.  Gastrointestinal: Patient reports intermittent reflux.  Denies abdominal pain, bloating, constipation, diarrhea or blood in the stool.  GU: Denies urgency, frequency, pain with urination, burning sensation, blood in urine, odor or discharge. Musculoskeletal: Patient reports left knee pain and swelling, difficulty with gait.  Denies decrease in range of motion, muscle pain.  Skin: Denies redness, rashes, lesions or ulcercations.  Neurological: Denies dizziness, difficulty with memory, difficulty with speech or  problems with balance and coordination.  Psych: Denies anxiety, depression, SI/HI.  No other specific complaints in a complete review of systems (except as listed in HPI above).     Objective:   Physical Exam  BP 133/82 (BP Location: Right Arm, Patient Position: Sitting, Cuff Size: Normal)   Pulse 67   Temp (!) 97.1 F (36.2 C) (Temporal)   Resp 18   Ht 5' 1.5" (1.562 m)   Wt 98.2 kg   LMP 09/10/2020   SpO2 100%   BMI 40.23 kg/m   Wt Readings from Last 3 Encounters:  07/13/20 215 lb 3.2 oz (97.6 kg)  10/28/19 231 lb (104.8 kg)  10/21/19 229 lb 4.8 oz (104 kg)    General: Appears her stated age, obese, in NAD. Skin: Warm, dry and intact. No rashes noted. HEENT: Head: normal shape and size; Eyes: sclera white and EOMs intact;  Cardiovascular: Normal rate and rhythm. S1,S2 noted.  No murmur, rubs or gallops noted. No JVD or BLE edema.  Pulmonary/Chest: Normal effort and positive vesicular breath sounds. No respiratory distress. No wheezes, rales or ronchi noted.  Abdomen: Soft and nontender. Normal bowel sounds. No distention or masses noted.  Musculoskeletal: Normal flexion extension of the left knee.  1+ peripatellar swelling noted.  Pain with palpation along the lateral joint line.  Gait slow, steady without use of assistive device. Neurological: Alert and oriented. Psychiatric: Mood and affect normal. Behavior is normal. Judgment and thought content normal.     BMET    Component Value Date/Time   NA 138 09/16/2019 1155   K 4.4 09/16/2019 1155   CL 102 09/16/2019 1155   CO2 20 09/16/2019 1155   GLUCOSE 86 09/16/2019 1155   BUN 9 09/16/2019 1155   CREATININE 0.80 09/16/2019 1155   CALCIUM 9.6 09/16/2019 1155   GFRNONAA 87 09/16/2019 1155   GFRAA 100 09/16/2019 1155    Lipid Panel     Component Value Date/Time   CHOL 171 09/16/2019 1155   TRIG 209 (H) 09/16/2019 1155   HDL 34 (L) 09/16/2019 1155   CHOLHDL 5.0 (H) 09/16/2019 1155   LDLCALC 101 (H) 09/16/2019  1155    CBC    Component Value Date/Time   WBC 14.9 (H) 10/07/2019 1202   RBC 4.95 10/07/2019 1202   HGB 15.1 10/07/2019 1202   HCT 44.9 10/07/2019 1202   PLT 436 10/07/2019 1202   MCV 91 10/07/2019 1202   MCH 30.5 10/07/2019 1202   MCHC 33.6 10/07/2019 1202   RDW 12.3 10/07/2019 1202   LYMPHSABS 5.1 (H) 10/07/2019 1202   EOSABS 0.2 10/07/2019 1202   BASOSABS 0.1 10/07/2019 1202    Hgb A1C No results found for: HGBA1C        Assessment & Plan:    Nicki Reaper, NP This visit occurred during  the SARS-CoV-2 public health emergency.  Safety protocols were in place, including screening questions prior to the visit, additional usage of staff PPE, and extensive cleaning of exam room while observing appropriate contact time as indicated for disinfecting solutions.

## 2020-09-15 DIAGNOSIS — M25562 Pain in left knee: Secondary | ICD-10-CM | POA: Insufficient documentation

## 2020-09-15 DIAGNOSIS — K219 Gastro-esophageal reflux disease without esophagitis: Secondary | ICD-10-CM | POA: Insufficient documentation

## 2020-09-15 NOTE — Assessment & Plan Note (Signed)
C-Met and lipid profile today Encouraged her to consume a low-fat diet 

## 2020-09-15 NOTE — Assessment & Plan Note (Signed)
Following with orthopedics Has MRI scheduled Continue NSAIDs OTC as needed Encouraged weight loss as this can help reduce joint pain

## 2020-09-15 NOTE — Assessment & Plan Note (Signed)
Encouraged her to avoid foods that trigger reflux Encouraged weight loss as this can help improve reflux symptoms Continue Omeprazole OTC as needed

## 2020-09-15 NOTE — Assessment & Plan Note (Signed)
Controlled on Lisinopril ?Reinforced DASH diet and exercise for weight loss ?C-Met today ?

## 2020-10-15 ENCOUNTER — Ambulatory Visit: Payer: Self-pay

## 2020-10-15 NOTE — Telephone Encounter (Signed)
Pt wants to know if its ok for her to take meloxicam with lisinoprol. Please call back  Pt. Reports her orthopedic doctor has started her on Meloxicam. Wants to know if she can take this with the lisinopril. Please advise pt.

## 2020-10-15 NOTE — Telephone Encounter (Signed)
There is no interaction between those meds. Taking NSAID's long term can raise blood pressure but this is not really a concern with Meloxicam as it is Naproxen and Ibuprofen.

## 2020-10-16 NOTE — Telephone Encounter (Signed)
The pt was notified of the providers recommendation. No questions or concerns.

## 2020-10-27 LAB — COLOGUARD

## 2020-10-30 ENCOUNTER — Ambulatory Visit (INDEPENDENT_AMBULATORY_CARE_PROVIDER_SITE_OTHER): Payer: Managed Care, Other (non HMO) | Admitting: Obstetrics and Gynecology

## 2020-10-30 ENCOUNTER — Other Ambulatory Visit: Payer: Self-pay

## 2020-10-30 ENCOUNTER — Encounter: Payer: Self-pay | Admitting: Obstetrics and Gynecology

## 2020-10-30 VITALS — BP 142/90 | Ht 61.5 in | Wt 225.0 lb

## 2020-10-30 DIAGNOSIS — Z124 Encounter for screening for malignant neoplasm of cervix: Secondary | ICD-10-CM

## 2020-10-30 DIAGNOSIS — Z8249 Family history of ischemic heart disease and other diseases of the circulatory system: Secondary | ICD-10-CM | POA: Insufficient documentation

## 2020-10-30 DIAGNOSIS — Z1211 Encounter for screening for malignant neoplasm of colon: Secondary | ICD-10-CM | POA: Diagnosis not present

## 2020-10-30 DIAGNOSIS — Z1231 Encounter for screening mammogram for malignant neoplasm of breast: Secondary | ICD-10-CM | POA: Diagnosis not present

## 2020-10-30 DIAGNOSIS — Z01419 Encounter for gynecological examination (general) (routine) without abnormal findings: Secondary | ICD-10-CM

## 2020-10-30 NOTE — Patient Instructions (Signed)
I value your feedback and you entrusting us with your care. If you get a [facility] patient survey, I would appreciate you taking the time to let us know about your experience today. Thank you!  [facility]: [phone]      

## 2020-10-30 NOTE — Progress Notes (Signed)
Chief Complaint  Patient presents with   Gynecologic Exam    No concerns     HPI:      [name] is a 50 y.o. G1P1001 who LMP was Patient's last menstrual period was 10/03/2020 (approximate)., presents today for her annual examination. Her menses are monthly, lasting 4-5 days, mod flow. Dysmenorrhea none. She has had less than a day of minimal mid-cycle spotting past few months. Was on OCPs for cycle control but had to stop due to dx of HTN. Tried POPs but cycles irregular, so pt stopped them. No vasomotor sx.    Sex activity: not sexually active.  Last Pap: 10/28/19  Results were: no abnormalities /neg HPV DNA 2019.  Pt likes yearly paps.  Hx of STDs: none   Last mammogram: 12/09/19 Results were: normal--routine follow-up in 12 months There is a FH of breast cancer in her MGM and mat grt aunt, genetic testing not indicated. There is no FH of ovarian cancer. The patient does do self-breast exams.   Tobacco use: The patient denies current or previous tobacco use. Alcohol use: social drinker No drug use Exercise: min active now due to torn meniscus. Needs PT and probably surgery.   Colonoscopy: never; FH colon cancer in MGM. Cologuard ref sent last yr but pt never sent in specimen. Would like to do colonoscopy this yr.    She does get adequate calcium and Vitamin D in her diet.   Labs with PCP/work. Being eval for leukocytosis with hematology. Pt needs cardio ref. Mom with hypertrophic cardiomyopathy that is genetic per her cardio MD, so pt needs to be eval.   Past Medical History:  Diagnosis Date   Allergy    Hypertension    Menstrual migraine     Past Surgical History:  Procedure Laterality Date   MOUTH SURGERY     WISDOM TOOTH EXTRACTION      Family History  Problem Relation Age of Onset   Breast cancer Maternal Grandmother 16   Colon cancer Maternal Grandmother 62   Liver cancer Maternal Grandmother    Hypertension Mother    Diabetes Mother     Hypertension Father    Diabetes Father    Heart disease Father    Heart attack Father    Obesity Father    Hypertension Sister    Dementia Maternal Grandfather    Heart attack Paternal Grandfather     Social History   Socioeconomic History   Marital status: Divorced    Spouse name: Not on file   Number of children: Not on file   Years of education: Not on file   Highest education level: Some college, no degree  Occupational History   Not on file  Tobacco Use   Smoking status: Never   Smokeless tobacco: Never  Vaping Use   Vaping Use: Never used  Substance and Sexual Activity   Alcohol use: Yes    Comment: occassionally    Drug use: No   Sexual activity: Not Currently    Birth control/protection: None  Other Topics Concern   Not on file  Social History Narrative   Not on file   Social Determinants of Health   Financial Resource Strain: Not on file  Food Insecurity: Not on file  Transportation Needs: Not on file  Physical Activity: Not on file  Stress: Not on file  Social Connections: Not on file  Intimate Partner Violence: Not on file    Current Outpatient Medications on  File Prior to Visit  Medication Sig Dispense Refill   b complex vitamins tablet Take 1 tablet by mouth daily.     Cholecalciferol (VITAMIN D3) 10 MCG (400 UNIT) CAPS Take by mouth.     lisinopril (ZESTRIL) 10 MG tablet Take 1 tablet (10 mg total) by mouth daily. 90 tablet 1   loratadine (CLARITIN) 10 MG tablet Take 10 mg by mouth daily as needed.      Multiple Vitamin (MULTIVITAMIN) tablet Take 1 tablet by mouth every other day.      OMEPRAZOLE PO Take 10 mg by mouth as needed.      MOBIC 15 MG tablet Take 1 tablet by mouth daily. (Patient not taking: Reported on 10/30/2020)     No current facility-administered medications on file prior to visit.    ROS:  Review of Systems  Constitutional:  Negative for fatigue, fever and unexpected weight change.  Respiratory:  Negative for cough,  shortness of breath and wheezing.   Cardiovascular:  Negative for chest pain, palpitations and leg swelling.  Gastrointestinal:  Negative for blood in stool, constipation, diarrhea, nausea and vomiting.  Endocrine: Negative for cold intolerance, heat intolerance and polyuria.  Genitourinary:  Negative for dyspareunia, dysuria, flank pain, frequency, genital sores, hematuria, menstrual problem, pelvic pain, urgency, vaginal bleeding, vaginal discharge and vaginal pain.  Musculoskeletal:  Negative for back pain, joint swelling and myalgias.  Skin:  Negative for rash.  Neurological:  Negative for dizziness, syncope, light-headedness, numbness and headaches.  Hematological:  Negative for adenopathy.  Psychiatric/Behavioral:  Negative for agitation, confusion, sleep disturbance and suicidal ideas. The patient is not nervous/anxious.     Objective: BP (!) 142/90   Ht 5' 1.5" (1.562 m)   Wt 225 lb (102.1 kg)   LMP 10/03/2020 (Approximate)   BMI 41.82 kg/m    Physical Exam Constitutional:      Appearance: She is well-developed.  Genitourinary:     Vulva normal.     Right Labia: No rash, tenderness or lesions.    Left Labia: No tenderness, lesions or rash.    No vaginal discharge, erythema or tenderness.      Right Adnexa: not tender and no mass present.    Left Adnexa: not tender and no mass present.    No cervical motion tenderness, friability or polyp.     Uterus is not enlarged or tender.  Breasts:    Right: No mass, nipple discharge, skin change or tenderness.     Left: No mass, nipple discharge, skin change or tenderness.  Neck:     Thyroid: No thyromegaly.  Cardiovascular:     Rate and Rhythm: Normal rate and regular rhythm.     Heart sounds: Normal heart sounds. No murmur heard. Pulmonary:     Effort: Pulmonary effort is normal.     Breath sounds: Normal breath sounds.  Abdominal:     Palpations: Abdomen is soft.     Tenderness: There is no abdominal tenderness. There is  no guarding or rebound.  Musculoskeletal:        General: Normal range of motion.     Cervical back: Normal range of motion.  Lymphadenopathy:     Cervical: No cervical adenopathy.  Neurological:     General: No focal deficit present.     Mental Status: She is alert and oriented to person, place, and time.     Cranial Nerves: No cranial nerve deficit.  Skin:    General: Skin is warm and dry.  Psychiatric:  Mood and Affect: Mood normal.        Behavior: Behavior normal.        Thought Content: Thought content normal.        Judgment: Judgment normal.  Vitals reviewed.    Assessment/Plan: Encounter for annual routine gynecological examination  Cervical cancer screening - Plan: IGP, rfx Aptima HPV ASCU  Encounter for screening mammogram for malignant neoplasm of breast - Plan: MM 3D SCREEN BREAST BILATERAL; pt to sched mammo  Screening for colon cancer - Plan: Ambulatory referral to Gastroenterology; scr colonoscopy due to age.  Family history of hypertrophic cardiomyopathy - Plan: Ambulatory referral to Cardiology; refer to cardio for further eval.             GYN counsel breast self exam, mammography screening, adequate intake of calcium and vitamin D, diet and exercise     F/U  Return in about 1 year (around 10/30/2021).  Rakel Junio B. Filemon Breton, PA-C 10/30/2020 4:26 PM

## 2020-11-01 LAB — IGP, RFX APTIMA HPV ASCU

## 2020-11-13 ENCOUNTER — Encounter: Payer: Self-pay | Admitting: Oncology

## 2020-11-27 ENCOUNTER — Other Ambulatory Visit: Payer: Self-pay | Admitting: Family Medicine

## 2020-11-27 DIAGNOSIS — I10 Essential (primary) hypertension: Secondary | ICD-10-CM

## 2020-11-27 NOTE — Telephone Encounter (Signed)
Requested Prescriptions  Pending Prescriptions Disp Refills  . lisinopril (ZESTRIL) 10 MG tablet [Pharmacy Med Name: Lisinopril 10 MG Oral Tablet] 90 tablet 1    Sig: TAKE 1 TABLET BY MOUTH  DAILY     Cardiovascular:  ACE Inhibitors Failed - 11/27/2020 11:37 PM      Failed - Cr in normal range and within 180 days    Creatinine, Ser  Date Value Ref Range Status  09/16/2019 0.80 0.57 - 1.00 mg/dL Final         Failed - K in normal range and within 180 days    Potassium  Date Value Ref Range Status  09/16/2019 4.4 3.5 - 5.2 mmol/L Final         Failed - Last BP in normal range    BP Readings from Last 1 Encounters:  10/30/20 (!) 142/90         Passed - Patient is not pregnant      Passed - Valid encounter within last 6 months    Recent Outpatient Visits          2 months ago Essential hypertension   [facility], Salvadore Oxford, NP   4 months ago Chronic pain of left knee   [facility] [place], Netta Neat, DO   1 year ago Abnormal laboratory test result   [facility], Jodelle Gross, FNP   2 years ago Essential hypertension   [facility] Smitty Cords, DO   2 years ago Essential hypertension   [facility] Rudd, Alison Stalling, NP      Future Appointments            In 1 week Gollan, Tollie Pizza, MD [facility]   In 5 months Creig Hines, MD [facility]

## 2020-12-07 ENCOUNTER — Other Ambulatory Visit: Payer: Self-pay

## 2020-12-07 ENCOUNTER — Encounter: Payer: Self-pay | Admitting: Cardiovascular Disease

## 2020-12-07 ENCOUNTER — Ambulatory Visit: Payer: Managed Care, Other (non HMO) | Admitting: Cardiovascular Disease

## 2020-12-07 DIAGNOSIS — I10 Essential (primary) hypertension: Secondary | ICD-10-CM | POA: Diagnosis not present

## 2020-12-07 DIAGNOSIS — Z8249 Family history of ischemic heart disease and other diseases of the circulatory system: Secondary | ICD-10-CM

## 2020-12-07 NOTE — Progress Notes (Signed)
Cardiology Office Note  Date:  12/08/2020   ID:  Cedar, Roseman [birth year], MRN [id number]  PCP:  Lorre Munroe, NP   Chief Complaint  Patient presents with   New Patient (Initial Visit)    Ref by Althea Grimmer, PA-C for a family history of hypertrophic cardiomyopathy. Medications reviewed by the patient verbally.     HPI:  [name] is a 50 year old woman with past medical history of Hypertension Hyperlipidemia GERD obersity Referred by [facility] for family history of HCM Needs evaluation  On discussion of her family history, she reports that her Mother has HCM among other cardiac issues, Needs valve replacement Scheduled for a cardiac catheterization, details unclear Work-up through [facility]  Father smoker, CAD, MI  She is active, weight an issue, No SOB on exertion, goes to beach frequently Playing pickle ball, knee injury Injury to meniscus but continues to try to stay active  Non-smoker, no diabetes  Lab work reviewed Total chol 171 LDL 101  EKG personally reviewed by myself on todays visit Normal sinus rhythm rate 87 bpm no significant ST-T wave changes  PMH:   has a past medical history of Allergy, Hypertension, and Menstrual migraine.  PSH:    Past Surgical History:  Procedure Laterality Date   MOUTH SURGERY     WISDOM TOOTH EXTRACTION      Current Outpatient Medications  Medication Sig Dispense Refill   b complex vitamins tablet Take 1 tablet by mouth daily.     Cholecalciferol (VITAMIN D3) 10 MCG (400 UNIT) CAPS Take by mouth.     lisinopril (ZESTRIL) 10 MG tablet TAKE 1 TABLET BY MOUTH  DAILY 90 tablet 1   loratadine (CLARITIN) 10 MG tablet Take 10 mg by mouth daily as needed.      Multiple Vitamin (MULTIVITAMIN) tablet Take 1 tablet by mouth every other day.      OMEPRAZOLE PO Take 10 mg by mouth as needed.      MOBIC 15 MG tablet Take 1 tablet by mouth daily. (Patient not taking: No sig reported)     No current  facility-administered medications for this visit.     Allergies:   Patient has no known allergies.   Social History:  The patient  reports that she has never smoked. She has never used smokeless tobacco. She reports current alcohol use. She reports that she does not use drugs.   Family History:   family history includes Breast cancer (age of onset: 69) in her maternal grandmother; Colon cancer (age of onset: 68) in her maternal grandmother; Dementia in her maternal grandfather; Diabetes in her father and mother; Heart attack in her father and paternal grandfather; Heart disease in her father; Hypertension in her father, mother, and sister; Liver cancer in her maternal grandmother; Obesity in her father.    Review of Systems: Review of Systems  Constitutional: Negative.   HENT: Negative.    Respiratory: Negative.    Cardiovascular: Negative.   Gastrointestinal: Negative.   Musculoskeletal: Negative.   Neurological: Negative.   Psychiatric/Behavioral: Negative.    All other systems reviewed and are negative.   PHYSICAL EXAM: VS:  BP (!) 148/90 (BP Location: Left Arm, Patient Position: Sitting, Cuff Size: Large)   Pulse 87   Ht 5\' 1"  (1.549 m)   Wt 224 lb 6 oz (101.8 kg)   SpO2 98%   BMI 42.40 kg/m  , BMI Body mass index is 42.4 kg/m. GEN: Well nourished, well developed, in no  acute distress HEENT: normal Neck: no JVD, carotid bruits, or masses Cardiac: RRR; no murmurs, rubs, or gallops,no edema  Respiratory:  clear to auscultation bilaterally, normal work of breathing GI: soft, nontender, nondistended, + BS MS: no deformity or atrophy Skin: warm and dry, no rash Neuro:  Strength and sensation are intact Psych: euthymic mood, full affect   Recent Labs: No results found for requested labs within last 8760 hours.    Lipid Panel Lab Results  Component Value Date   CHOL 171 09/16/2019   HDL 34 (L) 09/16/2019   LDLCALC 101 (H) 09/16/2019   TRIG 209 (H) 09/16/2019       Wt Readings from Last 3 Encounters:  12/07/20 224 lb 6 oz (101.8 kg)  10/30/20 225 lb (102.1 kg)  09/14/20 216 lb 6.4 oz (98.2 kg)      ASSESSMENT AND PLAN:  Problem List Items Addressed This Visit     Family history of hypertrophic cardiomyopathy   Relevant Orders   EKG 12-Lead   ECHOCARDIOGRAM COMPLETE   Other Visit Diagnoses     Morbid obesity (HCC)    -  Primary   Primary hypertension           Morbid obesity We would recommend exercise, careful diet management in an effort to lose weight.  Cardiac risk factors Family history, No smoking, no diabetes, cholesterol very reasonable  Family history HCM EKG benign, no significant murmur on exam, echocardiogram has been ordered for definitive evaluation    Total encounter time more than 60 minutes  Greater than 50% was spent in counseling and coordination of care with the patient  Patient was seen in consultation for [facility] will be referred back to her office for ongoing care of the issues detailed above   Signed, Dossie Arbour, M.D., Ph.D. [facility] [place], [place] [phone]

## 2020-12-07 NOTE — Patient Instructions (Addendum)
Echo for family hx of HCM    Medication Instructions:  No changes  If you need a refill on your cardiac medications before your next appointment, please call your pharmacy.    Lab work: No new labs needed   If you have labs (blood work) drawn today and your tests are completely normal, you will receive your results only by: MyChart Message (if you have MyChart) OR A paper copy in the mail If you have any lab test that is abnormal or we need to change your treatment, we will call you to review the results.   Testing/Procedures: Echocardiogram for family history of hypertrophic cardiomyopathy. We will call with your results.    Follow-Up: At [facility], you and your health needs are our priority.  As part of our continuing mission to provide you with exceptional heart care, we have created designated Provider Care Teams.  These Care Teams include your primary Cardiologist (physician) and Advanced Practice Providers (APPs -  Physician Assistants and Nurse Practitioners) who all work together to provide you with the care you need, when you need it.  You will need a follow up appointment as needed  Providers on your designated Care Team:   Nicolasa Ducking, NP Eula Listen, PA-C Marisue Ivan, PA-C Cadence Fransico Michael, [place]  Any Other Special Instructions Will Be Listed Below (If Applicable).  COVID-19 Vaccine Information can be found at: [email or web address] For questions related to vaccine distribution or appointments, please email [email or web address] or call [phone].

## 2020-12-10 ENCOUNTER — Telehealth: Payer: Self-pay

## 2020-12-10 NOTE — Telephone Encounter (Signed)
-----   Message from Jefferey Pica, RN sent at 12/10/2020  2:02 PM EDT ----- Elita Quick checked this patient out for me/ TG on Friday 7/29. He ordered an echo, but I don't see that it is scheduled.  Not sure what happened- do you mind reaching out to her to schedule please!  Thank you!

## 2020-12-10 NOTE — Telephone Encounter (Signed)
L MOM to schedule echocardiogram.  

## 2020-12-12 ENCOUNTER — Encounter: Payer: Self-pay | Admitting: *Deleted

## 2020-12-14 ENCOUNTER — Other Ambulatory Visit: Payer: Self-pay

## 2020-12-14 ENCOUNTER — Ambulatory Visit
Admission: RE | Admit: 2020-12-14 | Discharge: 2020-12-14 | Disposition: A | Discharge status: Home / Self Care | Payer: Managed Care, Other (non HMO) | Source: Ambulatory Visit | Attending: Obstetrics and Gynecology | Admitting: Obstetrics and Gynecology

## 2020-12-14 DIAGNOSIS — Z1231 Encounter for screening mammogram for malignant neoplasm of breast: Secondary | ICD-10-CM | POA: Insufficient documentation

## 2020-12-19 ENCOUNTER — Telehealth (INDEPENDENT_AMBULATORY_CARE_PROVIDER_SITE_OTHER): Payer: Self-pay | Admitting: Gastroenterology

## 2020-12-19 DIAGNOSIS — Z1211 Encounter for screening for malignant neoplasm of colon: Secondary | ICD-10-CM

## 2020-12-19 MED ORDER — CLENPIQ 10-3.5-12 MG-GM -GM/160ML PO SOLN: 1.0000 | Freq: Once | ORAL | 0 refills | Status: AC

## 2020-12-19 NOTE — Progress Notes (Signed)
Gastroenterology Pre-Procedure Review  Request Date: 03/01/2021 Requesting Physician: Dr. Servando Snare  PATIENT REVIEW QUESTIONS: The patient responded to the following health history questions as indicated:    1. Are you having any GI issues? no 2. Do you have a personal history of Polyps? no 3. Do you have a family history of Colon Cancer or Polyps? yes (maternal grandmother ) 4. Diabetes Mellitus? no 5. Joint replacements in the past 12 months?no 6. Major health problems in the past 3 months?no 7. Any artificial heart valves, MVP, or defibrillator?no    MEDICATIONS & ALLERGIES:    Patient reports the following regarding taking any anticoagulation/antiplatelet therapy:   Plavix, Coumadin, Eliquis, Xarelto, Lovenox, Pradaxa, Brilinta, or Effient? no Aspirin? no  Patient confirms/reports the following medications:  Current Outpatient Medications  Medication Sig Dispense Refill   b complex vitamins tablet Take 1 tablet by mouth daily.     Cholecalciferol (VITAMIN D3) 10 MCG (400 UNIT) CAPS Take by mouth.     lisinopril (ZESTRIL) 10 MG tablet TAKE 1 TABLET BY MOUTH  DAILY 90 tablet 1   loratadine (CLARITIN) 10 MG tablet Take 10 mg by mouth daily as needed.      Multiple Vitamin (MULTIVITAMIN) tablet Take 1 tablet by mouth every other day.      OMEPRAZOLE PO Take 10 mg by mouth as needed.      MOBIC 15 MG tablet Take 1 tablet by mouth daily. (Patient not taking: No sig reported)     No current facility-administered medications for this visit.    Patient confirms/reports the following allergies:  No Known Allergies  No orders of the defined types were placed in this encounter.   AUTHORIZATION INFORMATION Primary Insurance: 1D#: Group #:  Secondary Insurance: 1D#: Group #:  SCHEDULE INFORMATION: Date: 03/01/2021 Time: Location: [facility]

## 2020-12-31 ENCOUNTER — Telehealth: Payer: Self-pay | Admitting: Gastroenterology

## 2020-12-31 NOTE — Telephone Encounter (Signed)
Needs a cheaper bowel prep  [facility] [place]

## 2021-01-01 ENCOUNTER — Other Ambulatory Visit: Payer: Self-pay

## 2021-01-01 MED ORDER — PEG 3350-KCL-NA BICARB-NACL 420 G PO SOLR
4000.0000 mL | Freq: Once | ORAL | 0 refills | Status: AC
Start: 2021-01-01 — End: 2021-01-01

## 2021-01-01 NOTE — Progress Notes (Signed)
Patient requested a cheaper bowel prep.

## 2021-02-01 ENCOUNTER — Ambulatory Visit (INDEPENDENT_AMBULATORY_CARE_PROVIDER_SITE_OTHER): Payer: Managed Care, Other (non HMO)

## 2021-02-01 ENCOUNTER — Other Ambulatory Visit: Payer: Self-pay

## 2021-02-01 DIAGNOSIS — I5030 Unspecified diastolic (congestive) heart failure: Secondary | ICD-10-CM | POA: Diagnosis not present

## 2021-02-01 DIAGNOSIS — I348 Other nonrheumatic mitral valve disorders: Secondary | ICD-10-CM | POA: Diagnosis not present

## 2021-02-01 DIAGNOSIS — Z8249 Family history of ischemic heart disease and other diseases of the circulatory system: Secondary | ICD-10-CM | POA: Diagnosis not present

## 2021-02-01 MED ORDER — PERFLUTREN LIPID MICROSPHERE
1.0000 mL | INTRAVENOUS | Status: AC | PRN
  Administered 2021-02-01: 2 mL via INTRAVENOUS

## 2021-02-02 LAB — ECHOCARDIOGRAM COMPLETE
AR max vel: 3.27 cm2
AV Area VTI: 3.48 cm2
AV Area mean vel: 3.12 cm2
AV Mean grad: 6 mmHg
AV Peak grad: 11.7 mmHg
Ao pk vel: 1.71 m/s
Area-P 1/2: 2.08 cm2
S' Lateral: 3 cm

## 2021-02-22 ENCOUNTER — Ambulatory Visit: Payer: Managed Care, Other (non HMO) | Admitting: Internal Medicine

## 2021-03-01 ENCOUNTER — Other Ambulatory Visit: Payer: Self-pay

## 2021-03-01 ENCOUNTER — Encounter: Payer: Self-pay | Admitting: Gastroenterology

## 2021-03-01 ENCOUNTER — Ambulatory Visit: Payer: Managed Care, Other (non HMO) | Admitting: Anesthesiology

## 2021-03-01 ENCOUNTER — Encounter
Admission: RE | Disposition: A | Discharge status: Home / Self Care | Payer: Self-pay | Source: Home / Self Care | Attending: Gastroenterology

## 2021-03-01 ENCOUNTER — Ambulatory Visit
Admission: RE | Admit: 2021-03-01 | Discharge: 2021-03-01 | Disposition: A | Discharge status: Home / Self Care | Payer: Managed Care, Other (non HMO) | Attending: Gastroenterology | Admitting: Gastroenterology

## 2021-03-01 DIAGNOSIS — Z1211 Encounter for screening for malignant neoplasm of colon: Secondary | ICD-10-CM | POA: Diagnosis not present

## 2021-03-01 DIAGNOSIS — Z8349 Family history of other endocrine, nutritional and metabolic diseases: Secondary | ICD-10-CM | POA: Insufficient documentation

## 2021-03-01 DIAGNOSIS — Z833 Family history of diabetes mellitus: Secondary | ICD-10-CM | POA: Insufficient documentation

## 2021-03-01 DIAGNOSIS — Z79899 Other long term (current) drug therapy: Secondary | ICD-10-CM | POA: Diagnosis not present

## 2021-03-01 DIAGNOSIS — Z8249 Family history of ischemic heart disease and other diseases of the circulatory system: Secondary | ICD-10-CM | POA: Diagnosis not present

## 2021-03-01 DIAGNOSIS — Z803 Family history of malignant neoplasm of breast: Secondary | ICD-10-CM | POA: Diagnosis not present

## 2021-03-01 DIAGNOSIS — Z791 Long term (current) use of non-steroidal anti-inflammatories (NSAID): Secondary | ICD-10-CM | POA: Diagnosis not present

## 2021-03-01 DIAGNOSIS — Z800 Family history of malignant neoplasm of digestive organs: Secondary | ICD-10-CM | POA: Insufficient documentation

## 2021-03-01 HISTORY — PX: COLONOSCOPY WITH PROPOFOL: SHX5780

## 2021-03-01 LAB — POCT PREGNANCY, URINE: Preg Test, Ur: NEGATIVE

## 2021-03-01 SURGERY — COLONOSCOPY WITH PROPOFOL
Anesthesia: General

## 2021-03-01 MED ORDER — PROPOFOL 500 MG/50ML IV EMUL
INTRAVENOUS | Status: DC | PRN
  Administered 2021-03-01: 140 ug/kg/min via INTRAVENOUS

## 2021-03-01 MED ORDER — SODIUM CHLORIDE 0.9 % IV SOLN: INTRAVENOUS | Status: DC

## 2021-03-01 MED ORDER — PROPOFOL 10 MG/ML IV BOLUS
INTRAVENOUS | Status: DC | PRN
  Administered 2021-03-01: 70 mg via INTRAVENOUS

## 2021-03-01 NOTE — H&P (Signed)
Wyline Mood, MD [address], [place], [place], [address] [address], [address], [place], [place], [address] Phone: [phone]  Fax: [phone]  Primary Care Physician:  Lorre Munroe, NP   Pre-Procedure History & Physical: HPI:  Tina Hurley is a 50 y.o. female is here for an colonoscopy.   Past Medical History:  Diagnosis Date   Allergy    Hypertension    Menstrual migraine     Past Surgical History:  Procedure Laterality Date   MOUTH SURGERY     WISDOM TOOTH EXTRACTION      Prior to Admission medications   Medication Sig Start Date End Date Taking? Authorizing Provider  lisinopril (ZESTRIL) 10 MG tablet TAKE 1 TABLET BY MOUTH  DAILY 11/27/20  Yes Karamalegos, Netta Neat, DO  b complex vitamins tablet Take 1 tablet by mouth daily.    [provider]  Cholecalciferol (VITAMIN D3) 10 MCG (400 UNIT) CAPS Take by mouth.    [provider]  loratadine (CLARITIN) 10 MG tablet Take 10 mg by mouth daily as needed.     [provider]  MOBIC 15 MG tablet Take 1 tablet by mouth daily. Patient not taking: No sig reported 10/02/20   [provider]  Multiple Vitamin (MULTIVITAMIN) tablet Take 1 tablet by mouth every other day.     [provider]  OMEPRAZOLE PO Take 10 mg by mouth as needed.     [provider]    Allergies as of 12/19/2020   (No Known Allergies)    Family History  Problem Relation Age of Onset   Breast cancer Maternal Grandmother 60   Colon cancer Maternal Grandmother 62   Liver cancer Maternal Grandmother    Hypertension Mother    Diabetes Mother    Hypertension Father    Diabetes Father    Heart disease Father    Heart attack Father    Obesity Father    Hypertension Sister    Dementia Maternal Grandfather    Heart attack Paternal Grandfather     Social History   Socioeconomic History   Marital status: Divorced    Spouse name: Not on file   Number of children: Not  on file   Years of education: Not on file   Highest education level: Some college, no degree  Occupational History   Not on file  Tobacco Use   Smoking status: Never   Smokeless tobacco: Never  Vaping Use   Vaping Use: Never used  Substance and Sexual Activity   Alcohol use: Yes    Comment: occassionally    Drug use: No   Sexual activity: Not Currently    Birth control/protection: None  Other Topics Concern   Not on file  Social History Narrative   Not on file   Social Determinants of Health   Financial Resource Strain: Not on file  Food Insecurity: Not on file  Transportation Needs: Not on file  Physical Activity: Not on file  Stress: Not on file  Social Connections: Not on file  Intimate Partner Violence: Not on file    Review of Systems: See HPI, otherwise negative ROS  Physical Exam: BP (!) 173/102   Pulse (!) 108   Temp 98 F (36.7 C) (Temporal)   Resp 18   Ht 5' 1.5" (1.562 m)   Wt 97.5 kg   SpO2 100%   BMI 39.97 kg/m  General:   Alert,  pleasant and cooperative in NAD  Head:  Normocephalic and atraumatic. Neck:  Supple; no masses or thyromegaly. Lungs:  Clear throughout to auscultation, normal respiratory effort.    Heart:  +S1, +S2, Regular rate and rhythm, No edema. Abdomen:  Soft, nontender and nondistended. Normal bowel sounds, without guarding, and without rebound.   Neurologic:  Alert and  oriented x4;  grossly normal neurologically.  Impression/Plan: Tina Hurley is here for an colonoscopy to be performed for Screening colonoscopy average risk   Risks, benefits, limitations, and alternatives regarding  colonoscopy have been reviewed with the patient.  Questions have been answered.  All parties agreeable.   Wyline Mood, MD  03/01/2021, 7:46 AM

## 2021-03-01 NOTE — Op Note (Signed)
[facility] Patient Name: Tina Hurley Procedure Date: 03/01/2021 7:46 AM MRN: [id number] Account #: [id number] Date of Birth: 1970/10/23 Admit Type: Outpatient Age: 50 Room: [facility] ENDO ROOM 4 Gender: Female Note Status: Finalized Instrument Name: Prentice Docker [id number] Procedure:             Colonoscopy Indications:           Screening for colorectal malignant neoplasm Providers:             Wyline Mood MD, MD Medicines:             Monitored Anesthesia Care Complications:         No immediate complications. Procedure:             Pre-Anesthesia Assessment:                        - Prior to the procedure, a History and Physical was                         performed, and patient medications, allergies and                         sensitivities were reviewed. The patient's tolerance                         of previous anesthesia was reviewed.                        - The risks and benefits of the procedure and the                         sedation options and risks were discussed with the                         patient. All questions were answered and informed                         consent was obtained.                        - ASA Grade Assessment: II - A patient with mild                         systemic disease.                        After obtaining informed consent, the colonoscope was                         passed under direct vision. Throughout the procedure,                         the patient's blood pressure, pulse, and oxygen                         saturations were monitored continuously. The                         Colonoscope was introduced through the anus and  advanced to the the cecum, identified by the                         appendiceal orifice. The colonoscopy was performed                         with ease. The patient tolerated the procedure well.                         The quality of the bowel  preparation was good. Findings:      The perianal and digital rectal examinations were normal.      The entire examined colon appeared normal on direct and retroflexion       views. Impression:            - The entire examined colon is normal on direct and                         retroflexion views.                        - No specimens collected. Recommendation:        - Discharge patient to home.                        - Resume previous diet.                        - Continue present medications.                        - Repeat colonoscopy in 10 years for screening                         purposes. Procedure Code(s):     --- Professional ---                        [phone], Colonoscopy, flexible; diagnostic, including                         collection of specimen(s) by brushing or washing, when                         performed (separate procedure) Diagnosis Code(s):     --- Professional ---                        Z12.11, Encounter for screening for malignant neoplasm                         of colon CPT copyright 2019 American Medical Association. All rights reserved. The codes documented in this report are preliminary and upon coder review may  be revised to meet current compliance requirements. Wyline Mood, MD Wyline Mood MD, MD 03/01/2021 8:05:26 AM This report has been signed electronically. Number of Addenda: 0 Note Initiated On: 03/01/2021 7:46 AM Scope Withdrawal Time: 0 hours 9 minutes 29 seconds  Total Procedure Duration: 0 hours 12 minutes 59 seconds  Estimated Blood Loss:  Estimated blood loss: none.      [facility]

## 2021-03-01 NOTE — Anesthesia Postprocedure Evaluation (Signed)
Anesthesia Post Note  Patient: Tina Hurley  Procedure(s) Performed: COLONOSCOPY WITH PROPOFOL  Patient location during evaluation: PACU Anesthesia Type: General Level of consciousness: awake, awake and alert and oriented Pain management: pain level controlled Vital Signs Assessment: post-procedure vital signs reviewed and stable Respiratory status: spontaneous breathing, nonlabored ventilation and respiratory function stable Cardiovascular status: stable Anesthetic complications: no   No notable events documented.   Last Vitals:  Vitals:   03/01/21 0710 03/01/21 0809  BP: (!) 173/102 102/72  Pulse: (!) 108 95  Resp: 18 (!) 21  Temp: 36.7 C (!) 36.4 C  SpO2: 100% 97%    Last Pain:  Vitals:   03/01/21 0829  TempSrc:   PainSc: 0-No pain                 VAN STAVEREN,Kahliyah Dick

## 2021-03-01 NOTE — Anesthesia Preprocedure Evaluation (Signed)
Anesthesia Evaluation  Patient identified by MRN, date of birth, ID band Patient awake    Reviewed: Allergy & Precautions, NPO status , Patient's Chart, lab work & pertinent test results  Airway Mallampati: II  TM Distance: >3 FB Neck ROM: full    Dental  (+) Teeth Intact   Pulmonary neg pulmonary ROS,    Pulmonary exam normal breath sounds clear to auscultation       Cardiovascular hypertension, Pt. on medications negative cardio ROS Normal cardiovascular exam Rhythm:Regular Rate:Normal     Neuro/Psych  Headaches, negative neurological ROS  negative psych ROS   GI/Hepatic negative GI ROS, Neg liver ROS, GERD  ,  Endo/Other  negative endocrine ROS  Renal/GU negative Renal ROS  negative genitourinary   Musculoskeletal negative musculoskeletal ROS (+)   Abdominal (+) + obese,   Peds negative pediatric ROS (+)  Hematology negative hematology ROS (+)   Anesthesia Other Findings Past Medical History: No date: Allergy No date: Hypertension No date: Menstrual migraine  Past Surgical History: No date: MOUTH SURGERY No date: WISDOM TOOTH EXTRACTION  BMI    Body Mass Index: 39.97 kg/m      Reproductive/Obstetrics negative OB ROS                             Anesthesia Physical Anesthesia Plan  ASA: 2  Anesthesia Plan: General   Post-op Pain Management:    Induction: Intravenous  PONV Risk Score and Plan: 1 and Propofol infusion and TIVA  Airway Management Planned: Nasal Cannula  Additional Equipment:   Intra-op Plan:   Post-operative Plan:   Informed Consent: I have reviewed the patients History and Physical, chart, labs and discussed the procedure including the risks, benefits and alternatives for the proposed anesthesia with the patient or authorized representative who has indicated his/her understanding and acceptance.     Dental Advisory Given  Plan Discussed  with: Anesthesiologist, CRNA and Surgeon  Anesthesia Plan Comments: (Patient consented for risks of anesthesia including but not limited to:  - adverse reactions to medications - risk of airway placement if required - damage to eyes, teeth, lips or other oral mucosa - nerve damage due to positioning  - sore throat or hoarseness - Damage to heart, brain, nerves, lungs, other parts of body or loss of life  Patient voiced understanding.)        Anesthesia Quick Evaluation

## 2021-03-01 NOTE — Transfer of Care (Signed)
Immediate Anesthesia Transfer of Care Note  Patient: Tina Hurley  Procedure(s) Performed: COLONOSCOPY WITH PROPOFOL  Patient Location: PACU  Anesthesia Type:General  Level of Consciousness: awake, alert  and oriented  Airway & Oxygen Therapy: Patient Spontanous Breathing  Post-op Assessment: Report given to RN and Post -op Vital signs reviewed and stable  Post vital signs: Reviewed and stable  Last Vitals:  Vitals Value Taken Time  BP    Temp    Pulse    Resp    SpO2      Last Pain:  Vitals:   03/01/21 0710  TempSrc: Temporal  PainSc: 0-No pain         Complications: No notable events documented.

## 2021-03-03 ENCOUNTER — Encounter: Payer: Self-pay | Admitting: Gastroenterology

## 2021-03-12 HISTORY — PX: OTHER SURGICAL HISTORY: SHX169

## 2021-05-07 ENCOUNTER — Telehealth: Payer: Managed Care, Other (non HMO) | Admitting: Nurse Practitioner

## 2021-05-07 ENCOUNTER — Encounter: Payer: Self-pay | Admitting: Oncology

## 2021-05-08 ENCOUNTER — Inpatient Hospital Stay: Payer: Managed Care, Other (non HMO) | Attending: Oncology | Admitting: Oncology

## 2021-05-08 DIAGNOSIS — D729 Disorder of white blood cells, unspecified: Secondary | ICD-10-CM

## 2021-05-08 NOTE — Progress Notes (Signed)
I connected with Tina Hurley on 05/08/21 at  1:00 PM EST by video enabled telemedicine visit and verified that I am speaking with the correct person using two identifiers.   I discussed the limitations, risks, security and privacy concerns of performing an evaluation and management service by telemedicine and the availability of in-person appointments. I also discussed with the patient that there may be a patient responsible charge related to this service. The patient expressed understanding and agreed to proceed.  Other persons participating in the visit and their role in the encounter:  none  Patient's location:  home Provider's location:  home  Chief Complaint:  Routine f/u of neutrophilia  History of present illness: Patient is a 50 year old female with a past medical history significant for for hypertension and GERD who has been referred to [place] for leukocytosis.  Most recent CBC from 10/07/2019 showed white count of 14.9, H&H of 15.1/24.9 and a platelet count of 436.  Differential mainly shows neutrophilia and lymphocytosis.  Her count 2 weeks prior to that was also elevated at 14.  I have another CBC for her from November 2017 when her white count was normal at 6.4.  Patient currently feels well and denies any recurrent infections or hospitalizations.  Her weight has remained stable.     Results of blood work from [facility] were as follows: CBC showed a white count of 13.2, H&H of 14/41.5 and a platelet count of 376.  Differential showed neutrophilia with an ANC of 7.6 and lymphocytosis with an absolute lymphocyte count of 4.6.  CMP was within normal limits.  Peripheral flow cytometry did not show any immunophenotypic abnormality.  Smear review showed mild neutrophilia and lymphocytosis.  Polymorphous population of lymphocytes is identified findings can be seen in acute/chronic infection or inflammatory state.  Interval history : Patient denies any recent infections.  She did have a colonoscopy in  October which was negative.  Had her left meniscus repaired since her last visit as well.  No other joint pain.  Feels well.   Review of Systems  Constitutional:  Negative for chills, fever, malaise/fatigue and weight loss.  HENT:  Negative for congestion, ear discharge and nosebleeds.   Eyes:  Negative for blurred vision.  Respiratory:  Negative for cough, hemoptysis, sputum production, shortness of breath and wheezing.   Cardiovascular:  Negative for chest pain, palpitations, orthopnea and claudication.  Gastrointestinal:  Negative for abdominal pain, blood in stool, constipation, diarrhea, heartburn, melena, nausea and vomiting.  Genitourinary:  Negative for dysuria, flank pain, frequency, hematuria and urgency.  Musculoskeletal:  Positive for joint pain (left knee). Negative for back pain and myalgias.  Skin:  Negative for rash.  Neurological:  Negative for dizziness, tingling, focal weakness, seizures, weakness and headaches.  Endo/Heme/Allergies:  Does not bruise/bleed easily.  Psychiatric/Behavioral:  Negative for depression and suicidal ideas. The patient does not have insomnia.    No Known Allergies  Past Medical History:  Diagnosis Date   Allergy    Hypertension    Menstrual migraine     Past Surgical History:  Procedure Laterality Date   COLONOSCOPY WITH PROPOFOL N/A 03/01/2021   Procedure: COLONOSCOPY WITH PROPOFOL;  Surgeon: Jonathon Bellows, MD;  Location: [facility] ENDOSCOPY;  Service: Gastroenterology;  Laterality: N/A;   MOUTH SURGERY     WISDOM TOOTH EXTRACTION      Social History   Socioeconomic History   Marital status: Divorced    Spouse name: Not on file   Number of children: Not on file  Years of education: Not on file   Highest education level: Some college, no degree  Occupational History   Not on file  Tobacco Use   Smoking status: Never   Smokeless tobacco: Never  Vaping Use   Vaping Use: Never used  Substance and Sexual Activity   Alcohol use: Yes     Comment: occassionally    Drug use: No   Sexual activity: Not Currently    Birth control/protection: None  Other Topics Concern   Not on file  Social History Narrative   Not on file   Social Determinants of Health   Financial Resource Strain: Not on file  Food Insecurity: Not on file  Transportation Needs: Not on file  Physical Activity: Not on file  Stress: Not on file  Social Connections: Not on file  Intimate Partner Violence: Not on file    Family History  Problem Relation Age of Onset   Breast cancer Maternal Grandmother 60   Colon cancer Maternal Grandmother 62   Liver cancer Maternal Grandmother    Hypertension Mother    Diabetes Mother    Hypertension Father    Diabetes Father    Heart disease Father    Heart attack Father    Obesity Father    Hypertension Sister    Dementia Maternal Grandfather    Heart attack Paternal Grandfather      Current Outpatient Medications:    b complex vitamins tablet, Take 1 tablet by mouth daily., Disp: , Rfl:    Cholecalciferol (VITAMIN D3) 10 MCG (400 UNIT) CAPS, Take by mouth., Disp: , Rfl:    lisinopril (ZESTRIL) 10 MG tablet, TAKE 1 TABLET BY MOUTH  DAILY, Disp: 90 tablet, Rfl: 1   loratadine (CLARITIN) 10 MG tablet, Take 10 mg by mouth daily as needed. , Disp: , Rfl:    MOBIC 15 MG tablet, Take 1 tablet by mouth daily. (Patient not taking: No sig reported), Disp: , Rfl:    Multiple Vitamin (MULTIVITAMIN) tablet, Take 1 tablet by mouth every other day. , Disp: , Rfl:    OMEPRAZOLE PO, Take 10 mg by mouth as needed. , Disp: , Rfl:   No results found.  No images are attached to the encounter.   CMP Latest Ref Rng & Units 09/16/2019  Glucose 65 - 99 mg/dL 86  BUN 6 - 24 mg/dL 9  Creatinine 0.57 - 1.00 mg/dL 0.80  Sodium 134 - 144 mmol/L 138  Potassium 3.5 - 5.2 mmol/L 4.4  Chloride 96 - 106 mmol/L 102  CO2 20 - 29 mmol/L 20  Calcium 8.7 - 10.2 mg/dL 9.6  Total Protein 6.0 - 8.5 g/dL 7.0  Total Bilirubin 0.0 - 1.2  mg/dL 0.4  Alkaline Phos 39 - 117 IU/L 86  AST 0 - 40 IU/L 16  ALT 0 - 32 IU/L 20   CBC Latest Ref Rng & Units 10/07/2019  WBC 3.4 - 10.8 x10E3/uL 14.9(H)  Hemoglobin 11.1 - 15.9 g/dL 15.1  Hematocrit 34.0 - 46.6 % 44.9  Platelets 150 - 450 x10E3/uL 436     Observation/objective:appears in no acute distress over video visit today.   Assessment and plan: Patient is a 50 year old female with chronic leukocytosis and this is a routine follow-up visit.   Most recent labs from [place] show a white blood cell count of 12.0 with an H&H of 14.1/40.2 and a platelet count of 405,000.  Differential shows stable elevated neutrophils and lymphocytes.  Peripheral flow cytometry in the past has been  negative for lymphoma or leukemia.  Patient has declined bone marrow biopsy at this time.  Plan to have her come back in 6 months for lab work and virtual visit with Dr. Janese Banks.  I spent 25 minutes dedicated to the care of this patient (face-to-face and non-face-to-face) on the date of the encounter to include what is described in the assessment and plan.   Visit Diagnosis: 1. Neutrophilia    Faythe Casa, NP 05/08/2021 12:28 PM

## 2021-05-13 ENCOUNTER — Other Ambulatory Visit: Payer: Self-pay | Admitting: Family Medicine

## 2021-05-13 DIAGNOSIS — I10 Essential (primary) hypertension: Secondary | ICD-10-CM

## 2021-05-15 NOTE — Telephone Encounter (Signed)
Requested medications are due for refill today.  yes  Requested medications are on the active medications list.  yes  Last refill. 11/27/2020  Future visit scheduled.   no  Notes to clinic.  Labs are expired. Pt last seen 09/14/2020.    Requested Prescriptions  Pending Prescriptions Disp Refills   lisinopril (ZESTRIL) 10 MG tablet [Pharmacy Med Name: Lisinopril 10 MG Oral Tablet] 90 tablet 3    Sig: TAKE 1 TABLET BY MOUTH  DAILY     Cardiovascular:  ACE Inhibitors Failed - 05/13/2021 11:23 PM      Failed - Cr in normal range and within 180 days    Creatinine, Ser  Date Value Ref Range Status  09/16/2019 0.80 0.57 - 1.00 mg/dL Final          Failed - K in normal range and within 180 days    Potassium  Date Value Ref Range Status  09/16/2019 4.4 3.5 - 5.2 mmol/L Final          Failed - Valid encounter within last 6 months    Recent Outpatient Visits           8 months ago Essential hypertension   [facility] Esko, Salvadore Oxford, NP   10 months ago Chronic pain of left knee   [facility] Smitty Cords, DO   1 year ago Abnormal laboratory test result   [facility], Jodelle Gross, FNP   2 years ago Essential hypertension   [facility] Smitty Cords, DO   3 years ago Essential hypertension   [facility] Kyung Rudd, Alison Stalling, NP       Future Appointments             In 5 months Creig Hines, MD [facility]            Passed - Patient is not pregnant      Passed - Last BP in normal range    BP Readings from Last 1 Encounters:  03/01/21 102/72

## 2021-05-16 ENCOUNTER — Telehealth: Payer: Self-pay | Admitting: Internal Medicine

## 2021-05-16 NOTE — Telephone Encounter (Unsigned)
Pt called in for assistance. Pt says that   Pick up at her appt tomorrow.

## 2021-05-17 ENCOUNTER — Ambulatory Visit (INDEPENDENT_AMBULATORY_CARE_PROVIDER_SITE_OTHER): Payer: Managed Care, Other (non HMO) | Admitting: Internal Medicine

## 2021-05-17 ENCOUNTER — Other Ambulatory Visit: Payer: Self-pay

## 2021-05-17 ENCOUNTER — Encounter: Payer: Self-pay | Admitting: Internal Medicine

## 2021-05-17 VITALS — BP 129/65 | HR 100 | Temp 97.5°F | Resp 18 | Ht 61.5 in | Wt 225.4 lb

## 2021-05-17 DIAGNOSIS — Z0001 Encounter for general adult medical examination with abnormal findings: Secondary | ICD-10-CM

## 2021-05-17 DIAGNOSIS — I10 Essential (primary) hypertension: Secondary | ICD-10-CM

## 2021-05-17 DIAGNOSIS — Z114 Encounter for screening for human immunodeficiency virus [HIV]: Secondary | ICD-10-CM | POA: Diagnosis not present

## 2021-05-17 DIAGNOSIS — E6609 Other obesity due to excess calories: Secondary | ICD-10-CM | POA: Insufficient documentation

## 2021-05-17 DIAGNOSIS — E66812 Obesity, class 2: Secondary | ICD-10-CM | POA: Insufficient documentation

## 2021-05-17 DIAGNOSIS — Z1159 Encounter for screening for other viral diseases: Secondary | ICD-10-CM

## 2021-05-17 DIAGNOSIS — Z789 Other specified health status: Secondary | ICD-10-CM | POA: Diagnosis not present

## 2021-05-17 MED ORDER — LISINOPRIL 10 MG PO TABS: 10.0000 mg | ORAL_TABLET | Freq: Every day | ORAL | 1 refills | Status: DC

## 2021-05-17 NOTE — Patient Instructions (Signed)
Health Maintenance for Postmenopausal Women ?Menopause is a normal process in which your ability to get pregnant comes to an end. This process happens slowly over many months or years, usually between the ages of 48 and 55. Menopause is complete when you have missed your menstrual period for 12 months. ?It is important to talk with your health care provider about some of the most common conditions that affect women after menopause (postmenopausal women). These include heart disease, cancer, and bone loss (osteoporosis). Adopting a healthy lifestyle and getting preventive care can help to promote your health and wellness. The actions you take can also lower your chances of developing some of these common conditions. ?What are the signs and symptoms of menopause? ?During menopause, you may have the following symptoms: ?Hot flashes. These can be moderate or severe. ?Night sweats. ?Decrease in sex drive. ?Mood swings. ?Headaches. ?Tiredness (fatigue). ?Irritability. ?Memory problems. ?Problems falling asleep or staying asleep. ?Talk with your health care provider about treatment options for your symptoms. ?Do I need hormone replacement therapy? ?Hormone replacement therapy is effective in treating symptoms that are caused by menopause, such as hot flashes and night sweats. ?Hormone replacement carries certain risks, especially as you become older. If you are thinking about using estrogen or estrogen with progestin, discuss the benefits and risks with your health care provider. ?How can I reduce my risk for heart disease and stroke? ?The risk of heart disease, heart attack, and stroke increases as you age. One of the causes may be a change in the body's hormones during menopause. This can affect how your body uses dietary fats, triglycerides, and cholesterol. Heart attack and stroke are medical emergencies. There are many things that you can do to help prevent heart disease and stroke. ?Watch your blood pressure ?High  blood pressure causes heart disease and increases the risk of stroke. This is more likely to develop in people who have high blood pressure readings or are overweight. ?Have your blood pressure checked: ?Every 3-5 years if you are 18-39 years of age. ?Every year if you are 40 years old or older. ?Eat a healthy diet ? ?Eat a diet that includes plenty of vegetables, fruits, low-fat dairy products, and lean protein. ?Do not eat a lot of foods that are high in solid fats, added sugars, or sodium. ?Get regular exercise ?Get regular exercise. This is one of the most important things you can do for your health. Most adults should: ?Try to exercise for at least 150 minutes each week. The exercise should increase your heart rate and make you sweat (moderate-intensity exercise). ?Try to do strengthening exercises at least twice each week. Do these in addition to the moderate-intensity exercise. ?Spend less time sitting. Even light physical activity can be beneficial. ?Other tips ?Work with your health care provider to achieve or maintain a healthy weight. ?Do not use any products that contain nicotine or tobacco. These products include cigarettes, chewing tobacco, and vaping devices, such as e-cigarettes. If you need help quitting, ask your health care provider. ?Know your numbers. Ask your health care provider to check your cholesterol and your blood sugar (glucose). Continue to have your blood tested as directed by your health care provider. ?Do I need screening for cancer? ?Depending on your health history and family history, you may need to have cancer screenings at different stages of your life. This may include screening for: ?Breast cancer. ?Cervical cancer. ?Lung cancer. ?Colorectal cancer. ?What is my risk for osteoporosis? ?After menopause, you may be   at increased risk for osteoporosis. Osteoporosis is a condition in which bone destruction happens more quickly than new bone creation. To help prevent osteoporosis or  the bone fractures that can happen because of osteoporosis, you may take the following actions: ?If you are 19-50 years old, get at least 1,000 mg of calcium and at least 600 international units (IU) of vitamin D per day. ?If you are older than age 50 but younger than age 70, get at least 1,200 mg of calcium and at least 600 international units (IU) of vitamin D per day. ?If you are older than age 70, get at least 1,200 mg of calcium and at least 800 international units (IU) of vitamin D per day. ?Smoking and drinking excessive alcohol increase the risk of osteoporosis. Eat foods that are rich in calcium and vitamin D, and do weight-bearing exercises several times each week as directed by your health care provider. ?How does menopause affect my mental health? ?Depression may occur at any age, but it is more common as you become older. Common symptoms of depression include: ?Feeling depressed. ?Changes in sleep patterns. ?Changes in appetite or eating patterns. ?Feeling an overall lack of motivation or enjoyment of activities that you previously enjoyed. ?Frequent crying spells. ?Talk with your health care provider if you think that you are experiencing any of these symptoms. ?General instructions ?See your health care provider for regular wellness exams and vaccines. This may include: ?Scheduling regular health, dental, and eye exams. ?Getting and maintaining your vaccines. These include: ?Influenza vaccine. Get this vaccine each year before the flu season begins. ?Pneumonia vaccine. ?Shingles vaccine. ?Tetanus, diphtheria, and pertussis (Tdap) booster vaccine. ?Your health care provider may also recommend other immunizations. ?Tell your health care provider if you have ever been abused or do not feel safe at home. ?Summary ?Menopause is a normal process in which your ability to get pregnant comes to an end. ?This condition causes hot flashes, night sweats, decreased interest in sex, mood swings, headaches, or lack  of sleep. ?Treatment for this condition may include hormone replacement therapy. ?Take actions to keep yourself healthy, including exercising regularly, eating a healthy diet, watching your weight, and checking your blood pressure and blood sugar levels. ?Get screened for cancer and depression. Make sure that you are up to date with all your vaccines. ?This information is not intended to replace advice given to you by your health care provider. Make sure you discuss any questions you have with your health care provider. ?Document Revised: 09/17/2020 Document Reviewed: 09/17/2020 ?Elsevier Patient Education ? 2022 [employer] ? ?

## 2021-05-17 NOTE — Assessment & Plan Note (Signed)
Encouraged diet and exercise for weight loss ?

## 2021-05-17 NOTE — Progress Notes (Signed)
Subjective:    Patient ID: Tina Hurley, female    DOB: [birth year], 51 y.o.   MRN: [id number]  HPI  Pt presents to the clinic today for her annual exam.  Flu: never Tetanus: > 10 years ago Covid: never Shingrix: never Pap smear: 10/2020 Mammogram: 12/2020 Colon screening: 02/2021 Vision screening: annually Dentist: biannually  Diet: She does eat meat. She consumes fruits and veggies. She tries to avoid fried foods. She drinks mostly water, soda. Exercise: Pickle ball  Review of Systems     Past Medical History:  Diagnosis Date   Allergy    Hypertension    Menstrual migraine     Current Outpatient Medications  Medication Sig Dispense Refill   b complex vitamins tablet Take 1 tablet by mouth daily.     Cholecalciferol (VITAMIN D3) 10 MCG (400 UNIT) CAPS Take by mouth.     lisinopril (ZESTRIL) 10 MG tablet TAKE 1 TABLET BY MOUTH  DAILY 90 tablet 1   loratadine (CLARITIN) 10 MG tablet Take 10 mg by mouth daily as needed.      Multiple Vitamin (MULTIVITAMIN) tablet Take 1 tablet by mouth every other day.      OMEPRAZOLE PO Take 10 mg by mouth as needed.      No current facility-administered medications for this visit.    No Known Allergies  Family History  Problem Relation Age of Onset   Breast cancer Maternal Grandmother 51   Colon cancer Maternal Grandmother 62   Liver cancer Maternal Grandmother    Hypertension Mother    Diabetes Mother    Hypertension Father    Diabetes Father    Heart disease Father    Heart attack Father    Obesity Father    Hypertension Sister    Dementia Maternal Grandfather    Heart attack Paternal Grandfather     Social History   Socioeconomic History   Marital status: Divorced    Spouse name: Not on file   Number of children: Not on file   Years of education: Not on file   Highest education level: Some college, no degree  Occupational History   Not on file  Tobacco Use   Smoking status: Never   Smokeless tobacco:  Never  Vaping Use   Vaping Use: Never used  Substance and Sexual Activity   Alcohol use: Yes    Comment: occassionally    Drug use: No   Sexual activity: Not Currently    Birth control/protection: None  Other Topics Concern   Not on file  Social History Narrative   Not on file   Social Determinants of Health   Financial Resource Strain: Not on file  Food Insecurity: Not on file  Transportation Needs: Not on file  Physical Activity: Not on file  Stress: Not on file  Social Connections: Not on file  Intimate Partner Violence: Not on file     Constitutional: Denies fever, malaise, fatigue, headache or abrupt weight changes.  HEENT: Denies eye pain, eye redness, ear pain, ringing in the ears, wax buildup, runny nose, nasal congestion, bloody nose, or sore throat. Respiratory: Denies difficulty breathing, shortness of breath, cough or sputum production.   Cardiovascular: Denies chest pain, chest tightness, palpitations or swelling in the hands or feet.  Gastrointestinal: Denies abdominal pain, bloating, constipation, diarrhea or blood in the stool.  GU: Denies urgency, frequency, pain with urination, burning sensation, blood in urine, odor or discharge. Musculoskeletal: Pt reports intermittent right knee pain. Denies decrease  in range of motion, difficulty with gait, muscle pain or joint swelling.  Skin: Denies redness, rashes, lesions or ulcercations.  Neurological: Denies dizziness, difficulty with memory, difficulty with speech or problems with balance and coordination.  Psych: Denies anxiety, depression, SI/HI.  No other specific complaints in a complete review of systems (except as listed in HPI above).  Objective:   Physical Exam  BP 129/65 (BP Location: Right Arm, Patient Position: Sitting, Cuff Size: Large)    Pulse 100    Temp (!) 97.5 F (36.4 C) (Temporal)    Resp 18    Ht 5' 1.5" (1.562 m)    Wt 225 lb 6.4 oz (102.2 kg)    SpO2 99%    BMI 41.90 kg/m   Wt Readings  from Last 3 Encounters:  03/01/21 215 lb (97.5 kg)  12/07/20 224 lb 6 oz (101.8 kg)  10/30/20 225 lb (102.1 kg)    General: Appears her stated age, obese, in NAD. Skin: Warm, dry and intact. HEENT: Head: normal shape and size; Eyes: sclera white and EOMs intact;  Neck:  Neck supple, trachea midline. No masses, lumps or thyromegaly present.  Cardiovascular: Normal rate and rhythm. S1,S2 noted.  No murmur, rubs or gallops noted. No JVD or BLE edema. No carotid bruits noted. Pulmonary/Chest: Normal effort and positive vesicular breath sounds. No respiratory distress. No wheezes, rales or ronchi noted.  Abdomen: Soft and nontender. Normal bowel sounds. No distention or masses noted. Liver, spleen and kidneys non palpable. Musculoskeletal: Left knee joint enlarged. Strength 5/5 BUE/BLE. No difficulty with gait.  Neurological: Alert and oriented. Cranial nerves II-XII grossly intact. Coordination normal.  Psychiatric: Mood and affect normal. Behavior is normal. Judgment and thought content normal.    BMET    Component Value Date/Time   NA 138 09/16/2019 1155   K 4.4 09/16/2019 1155   CL 102 09/16/2019 1155   CO2 20 09/16/2019 1155   GLUCOSE 86 09/16/2019 1155   BUN 9 09/16/2019 1155   CREATININE 0.80 09/16/2019 1155   CALCIUM 9.6 09/16/2019 1155   GFRNONAA 87 09/16/2019 1155   GFRAA 100 09/16/2019 1155    Lipid Panel     Component Value Date/Time   CHOL 171 09/16/2019 1155   TRIG 209 (H) 09/16/2019 1155   HDL 34 (L) 09/16/2019 1155   CHOLHDL 5.0 (H) 09/16/2019 1155   LDLCALC 101 (H) 09/16/2019 1155    CBC    Component Value Date/Time   WBC 14.9 (H) 10/07/2019 1202   RBC 4.95 10/07/2019 1202   HGB 15.1 10/07/2019 1202   HCT 44.9 10/07/2019 1202   PLT 436 10/07/2019 1202   MCV 91 10/07/2019 1202   MCH 30.5 10/07/2019 1202   MCHC 33.6 10/07/2019 1202   RDW 12.3 10/07/2019 1202   LYMPHSABS 5.1 (H) 10/07/2019 1202   EOSABS 0.2 10/07/2019 1202   BASOSABS 0.1 10/07/2019  1202    Hgb A1C No results found for: HGBA1C         Assessment & Plan:   Preventative Health Maintenance:  She declines flu shot She declines tetanus shot Encouraged her to get her covid Discussed Shingrix vaccine, she will check coverage with her insurance company Pap smear UTD Mammogram UTD Colon screening UTD Encouraged her to consume a balanced diet and exercise regimen Advised her to see an eye doctor and dentist annually Will check CBC, CMET, Lipid, A1C, nicotene metabolites, HIV and Hep C today  RTC in 6 months, follow up chronic conditions Nicki Reaper,  NP This visit occurred during the SARS-CoV-2 public health emergency.  Safety protocols were in place, including screening questions prior to the visit, additional usage of staff PPE, and extensive cleaning of exam room while observing appropriate contact time as indicated for disinfecting solutions.

## 2021-05-20 ENCOUNTER — Encounter: Payer: Self-pay | Admitting: Internal Medicine

## 2021-05-21 LAB — COMPREHENSIVE METABOLIC PANEL
ALT: 21 IU/L (ref 0–32)
AST: 17 IU/L (ref 0–40)
Albumin/Globulin Ratio: 1.6 (ref 1.2–2.2)
Albumin: 4.1 g/dL (ref 3.8–4.9)
Alkaline Phosphatase: 83 IU/L (ref 44–121)
BUN/Creatinine Ratio: 18 (ref 9–23)
BUN: 14 mg/dL (ref 6–24)
Bilirubin Total: 0.3 mg/dL (ref 0.0–1.2)
CO2: 17 mmol/L — ABNORMAL LOW (ref 20–29)
Calcium: 9.6 mg/dL (ref 8.7–10.2)
Chloride: 103 mmol/L (ref 96–106)
Creatinine, Ser: 0.79 mg/dL (ref 0.57–1.00)
Globulin, Total: 2.6 g/dL (ref 1.5–4.5)
Glucose: 120 mg/dL — ABNORMAL HIGH (ref 70–99)
Potassium: 4.3 mmol/L (ref 3.5–5.2)
Sodium: 139 mmol/L (ref 134–144)
Total Protein: 6.7 g/dL (ref 6.0–8.5)
eGFR: 91 mL/min/{1.73_m2} (ref >59)

## 2021-05-21 LAB — CBC
Hematocrit: 41.2 % (ref 34.0–46.6)
Hemoglobin: 14 g/dL (ref 11.1–15.9)
MCH: 29.8 pg (ref 26.6–33.0)
MCHC: 34 g/dL (ref 31.5–35.7)
MCV: 88 fL (ref 79–97)
Platelets: 444 10*3/uL (ref 150–450)
RBC: 4.7 x10E6/uL (ref 3.77–5.28)
RDW: 12 % (ref 11.7–15.4)
WBC: 15.3 10*3/uL — ABNORMAL HIGH (ref 3.4–10.8)

## 2021-05-21 LAB — LIPID PANEL
Chol/HDL Ratio: 5.7 ratio — ABNORMAL HIGH (ref 0.0–4.4)
Cholesterol, Total: 181 mg/dL (ref 100–199)
HDL: 32 mg/dL — ABNORMAL LOW (ref >39)
LDL Chol Calc (NIH): 76 mg/dL (ref 0–99)
Triglycerides: 462 mg/dL — ABNORMAL HIGH (ref 0–149)
VLDL Cholesterol Cal: 73 mg/dL — ABNORMAL HIGH (ref 5–40)

## 2021-05-21 LAB — NICOTINE/COTININE METABOLITES
Cotinine: <1 ng/mL
Nicotine: <1 ng/mL

## 2021-05-21 LAB — HEMOGLOBIN A1C
Est. average glucose Bld gHb Est-mCnc: 117 mg/dL
Hgb A1c MFr Bld: 5.7 % — ABNORMAL HIGH (ref 4.8–5.6)

## 2021-05-21 LAB — HIV ANTIBODY (ROUTINE TESTING W REFLEX): HIV Screen 4th Generation wRfx: NONREACTIVE

## 2021-05-21 LAB — HEPATITIS C ANTIBODY: Hep C Virus Ab: 0.3 s/co ratio (ref 0.0–0.9)

## 2021-05-25 ENCOUNTER — Encounter: Payer: Self-pay | Admitting: Internal Medicine

## 2021-05-28 NOTE — Telephone Encounter (Signed)
Please review.  KP

## 2021-10-31 ENCOUNTER — Other Ambulatory Visit: Payer: Self-pay | Admitting: Internal Medicine

## 2021-10-31 DIAGNOSIS — I10 Essential (primary) hypertension: Secondary | ICD-10-CM

## 2021-10-31 NOTE — Telephone Encounter (Signed)
Requested Prescriptions  Pending Prescriptions Disp Refills  . lisinopril (ZESTRIL) 10 MG tablet [Pharmacy Med Name: Lisinopril 10 MG Oral Tablet] 90 tablet 0    Sig: TAKE 1 TABLET BY MOUTH DAILY     Cardiovascular:  ACE Inhibitors Passed - 10/31/2021 12:17 AM      Passed - Cr in normal range and within 180 days    Creatinine, Ser  Date Value Ref Range Status  05/17/2021 0.79 0.57 - 1.00 mg/dL Final         Passed - K in normal range and within 180 days    Potassium  Date Value Ref Range Status  05/17/2021 4.3 3.5 - 5.2 mmol/L Final         Passed - Patient is not pregnant      Passed - Last BP in normal range    BP Readings from Last 1 Encounters:  05/17/21 129/65         Passed - Valid encounter within last 6 months    Recent Outpatient Visits          5 months ago Encounter for general adult medical examination with abnormal findings   [facility] [place], Salvadore Oxford, NP   1 year ago Essential hypertension   [facility], Salvadore Oxford, NP   1 year ago Chronic pain of left knee   [facility] Smitty Cords, DO   2 years ago Abnormal laboratory test result   [facility], Jodelle Gross, FNP   3 years ago Essential hypertension   [facility], Netta Neat, DO

## 2021-11-06 ENCOUNTER — Telehealth: Payer: Managed Care, Other (non HMO) | Admitting: Oncology

## 2021-11-19 ENCOUNTER — Other Ambulatory Visit: Payer: Self-pay | Admitting: Obstetrics and Gynecology

## 2021-11-19 DIAGNOSIS — Z1231 Encounter for screening mammogram for malignant neoplasm of breast: Secondary | ICD-10-CM

## 2021-12-20 ENCOUNTER — Ambulatory Visit
Admission: RE | Admit: 2021-12-20 | Discharge: 2021-12-20 | Disposition: A | Discharge status: Home / Self Care | Payer: Managed Care, Other (non HMO) | Source: Ambulatory Visit | Attending: Obstetrics and Gynecology | Admitting: Obstetrics and Gynecology

## 2021-12-20 ENCOUNTER — Encounter: Payer: Self-pay | Admitting: Radiology

## 2021-12-20 DIAGNOSIS — Z1231 Encounter for screening mammogram for malignant neoplasm of breast: Secondary | ICD-10-CM | POA: Insufficient documentation

## 2022-01-20 ENCOUNTER — Other Ambulatory Visit: Payer: Self-pay | Admitting: Internal Medicine

## 2022-01-20 DIAGNOSIS — I10 Essential (primary) hypertension: Secondary | ICD-10-CM

## 2022-01-22 NOTE — Telephone Encounter (Signed)
Called, no answer. Left message to call back to schedule OV. Will refill medication for 30 days until patient can schedule OV.  Requested Prescriptions  Pending Prescriptions Disp Refills  . lisinopril (ZESTRIL) 10 MG tablet [Pharmacy Med Name: Lisinopril 10 MG Oral Tablet] 90 tablet 3    Sig: TAKE 1 TABLET BY MOUTH DAILY     Cardiovascular:  ACE Inhibitors Failed - 01/20/2022 10:19 PM      Failed - Cr in normal range and within 180 days    Creatinine, Ser  Date Value Ref Range Status  05/17/2021 0.79 0.57 - 1.00 mg/dL Final         Failed - K in normal range and within 180 days    Potassium  Date Value Ref Range Status  05/17/2021 4.3 3.5 - 5.2 mmol/L Final         Failed - Valid encounter within last 6 months    Recent Outpatient Visits          8 months ago Encounter for general adult medical examination with abnormal findings   [facility] [place], Salvadore Oxford, NP   1 year ago Essential hypertension   [facility], Salvadore Oxford, NP   1 year ago Chronic pain of left knee   [facility] [place], Netta Neat, DO   2 years ago Abnormal laboratory test result   [facility], Jodelle Gross, FNP   3 years ago Essential hypertension   [facility] [place], Netta Neat, [place]             Passed - Patient is not pregnant      Passed - Last BP in normal range    BP Readings from Last 1 Encounters:  05/17/21 129/65

## 2022-01-29 NOTE — Progress Notes (Unsigned)
No chief complaint on file.    HPI:      [name] is a 51 y.o. G1P1001 who LMP was No LMP recorded. (Menstrual status: Irregular Periods)., presents today for her annual examination. Her menses are monthly, lasting 4-5 days, mod flow. Dysmenorrhea none. She has had less than a day of minimal mid-cycle spotting past few months. Was on OCPs for cycle control but had to stop due to dx of HTN. Tried POPs but cycles irregular, so pt stopped them. No vasomotor sx.    Sex activity: not sexually active.  Last Pap: 10/30/20 Results were: no abnormalities /neg HPV DNA 2019.  Pt likes yearly paps.  Hx of STDs: none   Last mammogram: 12/20/21 Results were: normal--routine follow-up in 12 months There is a FH of breast cancer in her MGM and mat grt aunt, genetic testing not indicated. There is no FH of ovarian cancer. The patient does do self-breast exams.   Tobacco use: The patient denies current or previous tobacco use. Alcohol use: social drinker No drug use Exercise: min active now due to torn meniscus. Needs PT and probably surgery.   Colonoscopy: 10/22 with [facility]; re'eat due????/never; FH colon cancer in MGM. Cologuard ref sent last yr but pt never sent in specimen. Would like to do colonoscopy this yr.    She does get adequate calcium and Vitamin D in her diet.   Labs with PCP/work. Being eval for leukocytosis with hematology. Pt needs cardio ref. Mom with hypertrophic cardiomyopathy that is genetic per her cardio MD, so pt needs to be eval.   Past Medical History:  Diagnosis Date   Allergy    Hypertension    Menstrual migraine     Past Surgical History:  Procedure Laterality Date   COLONOSCOPY WITH PROPOFOL N/A 03/01/2021   Procedure: COLONOSCOPY WITH PROPOFOL;  Surgeon: Wyline Mood, MD;  Location: [facility];  Service: Gastroenterology;  Laterality: N/A;   MOUTH SURGERY     WISDOM TOOTH EXTRACTION      Family History  Problem Relation Age of Onset    Breast cancer Maternal Grandmother 45   Colon cancer Maternal Grandmother 62   Liver cancer Maternal Grandmother    Hypertension Mother    Diabetes Mother    Hypertension Father    Diabetes Father    Heart disease Father    Heart attack Father    Obesity Father    Hypertension Sister    Dementia Maternal Grandfather    Heart attack Paternal Grandfather     Social History   Socioeconomic History   Marital status: Divorced    Spouse name: Not on file   Number of children: Not on file   Years of education: Not on file   Highest education level: Some college, no degree  Occupational History   Not on file  Tobacco Use   Smoking status: Never   Smokeless tobacco: Never  Vaping Use   Vaping Use: Never used  Substance and Sexual Activity   Alcohol use: Yes    Comment: occassionally    Drug use: No   Sexual activity: Not Currently    Birth control/protection: None  Other Topics Concern   Not on file  Social History Narrative   Not on file   Social Determinants of Health   Financial Resource Strain: Low Risk  (02/05/2018)   Overall Financial Resource Strain (CARDIA)    Difficulty of Paying Living Expenses: Not hard at all  Food  Insecurity: No Food Insecurity (02/05/2018)   Hunger Vital Sign    Worried About Running Out of Food in the Last Year: Never true    Ran Out of Food in the Last Year: Never true  Transportation Needs: No Transportation Needs (02/05/2018)   PRAPARE - Hydrologist (Medical): No    Lack of Transportation (Non-Medical): No  Physical Activity: Sufficiently Active (09/28/2017)   Exercise Vital Sign    Days of Exercise per Week: 3 days    Minutes of Exercise per Session: 120 min  Stress: No Stress Concern Present (02/05/2018)   [place]    Feeling of Stress : Only a little  Social Connections: Moderately Isolated (02/05/2018)   Social Connection and Isolation  Panel [NHANES]    Frequency of Communication with Friends and Family: Twice a week    Frequency of Social Gatherings with Friends and Family: Twice a week    Attends Religious Services: Never    Marine scientist or Organizations: No    Attends Archivist Meetings: Never    Marital Status: Divorced  Human resources officer Violence: Not At Risk (02/05/2018)   Humiliation, Afraid, Rape, and Kick questionnaire    Fear of Current or Ex-Partner: No    Emotionally Abused: No    Physically Abused: No    Sexually Abused: No    Current Outpatient Medications on File Prior to Visit  Medication Sig Dispense Refill   b complex vitamins tablet Take 1 tablet by mouth daily.     Cholecalciferol (VITAMIN D3) 10 MCG (400 UNIT) CAPS Take by mouth.     lisinopril (ZESTRIL) 10 MG tablet TAKE 1 TABLET BY MOUTH DAILY 30 tablet 0   loratadine (CLARITIN) 10 MG tablet Take 10 mg by mouth daily as needed.      Multiple Vitamin (MULTIVITAMIN) tablet Take 1 tablet by mouth every other day.      OMEPRAZOLE PO Take 10 mg by mouth as needed.      No current facility-administered medications on file prior to visit.    ROS:  Review of Systems  Constitutional:  Negative for fatigue, fever and unexpected weight change.  Respiratory:  Negative for cough, shortness of breath and wheezing.   Cardiovascular:  Negative for chest pain, palpitations and leg swelling.  Gastrointestinal:  Negative for blood in stool, constipation, diarrhea, nausea and vomiting.  Endocrine: Negative for cold intolerance, heat intolerance and polyuria.  Genitourinary:  Negative for dyspareunia, dysuria, flank pain, frequency, genital sores, hematuria, menstrual problem, pelvic pain, urgency, vaginal bleeding, vaginal discharge and vaginal pain.  Musculoskeletal:  Negative for back pain, joint swelling and myalgias.  Skin:  Negative for rash.  Neurological:  Negative for dizziness, syncope, light-headedness, numbness and headaches.   Hematological:  Negative for adenopathy.  Psychiatric/Behavioral:  Negative for agitation, confusion, sleep disturbance and suicidal ideas. The patient is not nervous/anxious.      Objective: There were no vitals taken for this visit.   Physical Exam Constitutional:      Appearance: She is well-developed.  Genitourinary:     Vulva normal.     Right Labia: No rash, tenderness or lesions.    Left Labia: No tenderness, lesions or rash.    No vaginal discharge, erythema or tenderness.      Right Adnexa: not tender and no mass present.    Left Adnexa: not tender and no mass present.    No cervical  motion tenderness, friability or polyp.     Uterus is not enlarged or tender.  Breasts:    Right: No mass, nipple discharge, skin change or tenderness.     Left: No mass, nipple discharge, skin change or tenderness.  Neck:     Thyroid: No thyromegaly.  Cardiovascular:     Rate and Rhythm: Normal rate and regular rhythm.     Heart sounds: Normal heart sounds. No murmur heard. Pulmonary:     Effort: Pulmonary effort is normal.     Breath sounds: Normal breath sounds.  Abdominal:     Palpations: Abdomen is soft.     Tenderness: There is no abdominal tenderness. There is no guarding or rebound.  Musculoskeletal:        General: Normal range of motion.     Cervical back: Normal range of motion.  Lymphadenopathy:     Cervical: No cervical adenopathy.  Neurological:     General: No focal deficit present.     Mental Status: She is alert and oriented to person, place, and time.     Cranial Nerves: No cranial nerve deficit.  Skin:    General: Skin is warm and dry.  Psychiatric:        Mood and Affect: Mood normal.        Behavior: Behavior normal.        Thought Content: Thought content normal.        Judgment: Judgment normal.  Vitals reviewed.     Assessment/Plan: Encounter for annual routine gynecological examination  Cervical cancer screening - Plan: IGP, rfx Aptima HPV  ASCU  Encounter for screening mammogram for malignant neoplasm of breast - Plan: MM 3D SCREEN BREAST BILATERAL; pt to sched mammo  Screening for colon cancer - Plan: Ambulatory referral to Gastroenterology; scr colonoscopy due to age.  Family history of hypertrophic cardiomyopathy - Plan: Ambulatory referral to Cardiology; refer to cardio for further eval.             GYN counsel breast self exam, mammography screening, adequate intake of calcium and vitamin D, diet and exercise     F/U  No follow-ups on file.  Jarion Hawthorne B. Corean Yoshimura, PA-C 01/29/2022 4:18 PM

## 2022-01-30 ENCOUNTER — Encounter: Payer: Self-pay | Admitting: Obstetrics and Gynecology

## 2022-01-30 ENCOUNTER — Ambulatory Visit (INDEPENDENT_AMBULATORY_CARE_PROVIDER_SITE_OTHER): Payer: Managed Care, Other (non HMO) | Admitting: Obstetrics and Gynecology

## 2022-01-30 VITALS — BP 130/80 | Ht 61.5 in | Wt 221.0 lb

## 2022-01-30 DIAGNOSIS — Z124 Encounter for screening for malignant neoplasm of cervix: Secondary | ICD-10-CM | POA: Diagnosis not present

## 2022-01-30 DIAGNOSIS — Z1211 Encounter for screening for malignant neoplasm of colon: Secondary | ICD-10-CM

## 2022-01-30 DIAGNOSIS — Z1151 Encounter for screening for human papillomavirus (HPV): Secondary | ICD-10-CM

## 2022-01-30 DIAGNOSIS — Z1231 Encounter for screening mammogram for malignant neoplasm of breast: Secondary | ICD-10-CM | POA: Diagnosis not present

## 2022-01-30 DIAGNOSIS — Z01419 Encounter for gynecological examination (general) (routine) without abnormal findings: Secondary | ICD-10-CM

## 2022-01-30 NOTE — Patient Instructions (Signed)
I value your feedback and you entrusting us with your care. If you get a [facility] patient survey, I would appreciate you taking the time to let us know about your experience today. Thank you! ? ? ?

## 2022-02-05 LAB — IGP, APTIMA HPV: HPV Aptima: NEGATIVE

## 2022-02-07 ENCOUNTER — Encounter: Payer: Self-pay | Admitting: Internal Medicine

## 2022-02-07 ENCOUNTER — Ambulatory Visit (INDEPENDENT_AMBULATORY_CARE_PROVIDER_SITE_OTHER): Payer: Managed Care, Other (non HMO) | Admitting: Internal Medicine

## 2022-02-07 DIAGNOSIS — M25562 Pain in left knee: Secondary | ICD-10-CM | POA: Diagnosis not present

## 2022-02-07 DIAGNOSIS — K219 Gastro-esophageal reflux disease without esophagitis: Secondary | ICD-10-CM | POA: Diagnosis not present

## 2022-02-07 DIAGNOSIS — E782 Mixed hyperlipidemia: Secondary | ICD-10-CM | POA: Diagnosis not present

## 2022-02-07 DIAGNOSIS — R7303 Prediabetes: Secondary | ICD-10-CM

## 2022-02-07 DIAGNOSIS — G8929 Other chronic pain: Secondary | ICD-10-CM

## 2022-02-07 DIAGNOSIS — I10 Essential (primary) hypertension: Secondary | ICD-10-CM

## 2022-02-07 NOTE — Assessment & Plan Note (Signed)
Encouraged diet and exercise for weight loss ?

## 2022-02-07 NOTE — Assessment & Plan Note (Signed)
Encouraged weight loss as this can help reduce joint pain

## 2022-02-07 NOTE — Assessment & Plan Note (Signed)
Controlled on Lisinopril Reinforced DASH diet and exercise for weight loss CMET today 

## 2022-02-07 NOTE — Assessment & Plan Note (Signed)
Encouraged low carb diet and exercise for weight loss 

## 2022-02-07 NOTE — Assessment & Plan Note (Signed)
CMET and lipid profile today Encouraged low fat diet Start Fish Oil OTC

## 2022-02-07 NOTE — Patient Instructions (Signed)

## 2022-02-07 NOTE — Assessment & Plan Note (Signed)
Avoid foods that trigger your reflux Encouraged weight loss as this can help reduce reflux symptoms Continue Omeprazole as needed 

## 2022-02-07 NOTE — Progress Notes (Signed)
Subjective:    Patient ID: Tina Hurley, female    DOB: 10-Apr-1971, 51 y.o.   MRN: [id number]  HPI  Patient presents to clinic today for follow-up of chronic conditions.  HTN: Her BP today is 130/86.  She is taking Lisinopril as prescribed.  ECG from 11/2020 reviewed.  HLD: Her last LDL was 76, triglycerides 379, 05/2021.  She is not taking any cholesterol-lowering medication at this time.  She tries to consume a low-fat diet.  GERD: Triggered by eating too fast, tomato-based foods.  She takes Omeprazole as needed with good results.  Upper GI from 02/2021 reviewed.  Chronic Left Knee Pain: Managed with Ibuprofen as needed.  She follows with orthopedics.  Prediabetes: Her last A1c was 5.7%, 05/2021.  She is not taking any oral diabetic medication at this time.  She does not check her sugars.  Review of Systems     Past Medical History:  Diagnosis Date   Allergy    Hypertension    Menstrual migraine     Current Outpatient Medications  Medication Sig Dispense Refill   b complex vitamins tablet Take 1 tablet by mouth daily.     Cholecalciferol (VITAMIN D3) 10 MCG (400 UNIT) CAPS Take by mouth.     lisinopril (ZESTRIL) 10 MG tablet TAKE 1 TABLET BY MOUTH DAILY 30 tablet 0   loratadine (CLARITIN) 10 MG tablet Take 10 mg by mouth daily as needed.      Multiple Vitamin (MULTIVITAMIN) tablet Take 1 tablet by mouth every other day.      OMEPRAZOLE PO Take 10 mg by mouth as needed.      No current facility-administered medications for this visit.    No Known Allergies  Family History  Problem Relation Age of Onset   Breast cancer Maternal Grandmother 43   Colon cancer Maternal Grandmother 62   Liver cancer Maternal Grandmother    Hypertension Mother    Diabetes Mother    Hypertension Father    Diabetes Father    Heart disease Father    Heart attack Father    Obesity Father    Hypertension Sister    Dementia Maternal Grandfather    Heart attack Paternal Grandfather      Social History   Socioeconomic History   Marital status: Divorced    Spouse name: Not on file   Number of children: Not on file   Years of education: Not on file   Highest education level: Some college, no degree  Occupational History   Not on file  Tobacco Use   Smoking status: Never   Smokeless tobacco: Never  Vaping Use   Vaping Use: Never used  Substance and Sexual Activity   Alcohol use: Yes    Comment: occassionally    Drug use: No   Sexual activity: Not Currently    Birth control/protection: None  Other Topics Concern   Not on file  Social History Narrative   Not on file   Social Determinants of Health   Financial Resource Strain: Low Risk  (02/05/2018)   Overall Financial Resource Strain (CARDIA)    Difficulty of Paying Living Expenses: Not hard at all  Food Insecurity: No Food Insecurity (02/05/2018)   Hunger Vital Sign    Worried About Running Out of Food in the Last Year: Never true    Ran Out of Food in the Last Year: Never true  Transportation Needs: No Transportation Needs (02/05/2018)   PRAPARE - Transportation    Lack  of Transportation (Medical): No    Lack of Transportation (Non-Medical): No  Physical Activity: Sufficiently Active (09/28/2017)   Exercise Vital Sign    Days of Exercise per Week: 3 days    Minutes of Exercise per Session: 120 min  Stress: No Stress Concern Present (02/05/2018)   [place]    Feeling of Stress : Only a little  Social Connections: Moderately Isolated (02/05/2018)   Social Connection and Isolation Panel [NHANES]    Frequency of Communication with Friends and Family: Twice a week    Frequency of Social Gatherings with Friends and Family: Twice a week    Attends Religious Services: Never    Marine scientist or Organizations: No    Attends Archivist Meetings: Never    Marital Status: Divorced  Human resources officer Violence: Not At Risk  (02/05/2018)   Humiliation, Afraid, Rape, and Kick questionnaire    Fear of Current or Ex-Partner: No    Emotionally Abused: No    Physically Abused: No    Sexually Abused: No     Constitutional: Denies fever, malaise, fatigue, headache or abrupt weight changes.  HEENT: Denies eye pain, eye redness, ear pain, ringing in the ears, wax buildup, runny nose, nasal congestion, bloody nose, or sore throat. Respiratory: Denies difficulty breathing, shortness of breath, cough or sputum production.   Cardiovascular: Denies chest pain, chest tightness, palpitations or swelling in the hands or feet.  Gastrointestinal: Denies abdominal pain, bloating, constipation, diarrhea or blood in the stool.  GU: Denies urgency, frequency, pain with urination, burning sensation, blood in urine, odor or discharge. Musculoskeletal: Patient reports knee pain.  Denies decrease in range of motion, difficulty with gait, muscle pain or joint swelling.  Skin: Denies redness, rashes, lesions or ulcercations.  Neurological: Denies dizziness, difficulty with memory, difficulty with speech or problems with balance and coordination.  Psych: Denies anxiety, depression, SI/HI.  No other specific complaints in a complete review of systems (except as listed in HPI above).  Objective:   Physical Exam  BP 130/86 (BP Location: Left Arm, Patient Position: Sitting, Cuff Size: Normal)   Pulse 93   Temp (!) 97.3 F (36.3 C) (Temporal)   Wt 219 lb (99.3 kg)   LMP 01/06/2022 (Approximate)   SpO2 99%   BMI 40.71 kg/m   Wt Readings from Last 3 Encounters:  01/30/22 221 lb (100.2 kg)  05/17/21 225 lb 6.4 oz (102.2 kg)  03/01/21 215 lb (97.5 kg)    General: Appears her stated age, obese, in NAD. Skin: Warm, dry and intact.  HEENT: Head: normal shape and size; Eyes: sclera white, no icterus, conjunctiva pink, PERRLA and EOMs intact;  Cardiovascular: Normal rate and rhythm. S1,S2 noted.  No murmur, rubs or gallops noted. No JVD  or BLE edema. No carotid bruits noted. Pulmonary/Chest: Normal effort and positive vesicular breath sounds. No respiratory distress. No wheezes, rales or ronchi noted.  Abdomen: Normal bowel sounds. Musculoskeletal: No signs of joint swelling. No difficulty with gait.  Neurological: Alert and oriented.    BMET    Component Value Date/Time   NA 139 05/17/2021 1329   K 4.3 05/17/2021 1329   CL 103 05/17/2021 1329   CO2 17 (L) 05/17/2021 1329   GLUCOSE 120 (H) 05/17/2021 1329   BUN 14 05/17/2021 1329   CREATININE 0.79 05/17/2021 1329   CALCIUM 9.6 05/17/2021 1329   GFRNONAA 87 09/16/2019 1155   GFRAA 100 09/16/2019 1155  Lipid Panel     Component Value Date/Time   CHOL 181 05/17/2021 1329   TRIG 462 (H) 05/17/2021 1329   HDL 32 (L) 05/17/2021 1329   CHOLHDL 5.7 (H) 05/17/2021 1329   LDLCALC 76 05/17/2021 1329    CBC    Component Value Date/Time   WBC 15.3 (H) 05/17/2021 1329   RBC 4.70 05/17/2021 1329   HGB 14.0 05/17/2021 1329   HCT 41.2 05/17/2021 1329   PLT 444 05/17/2021 1329   MCV 88 05/17/2021 1329   MCH 29.8 05/17/2021 1329   MCHC 34.0 05/17/2021 1329   RDW 12.0 05/17/2021 1329   LYMPHSABS 5.1 (H) 10/07/2019 1202   EOSABS 0.2 10/07/2019 1202   BASOSABS 0.1 10/07/2019 1202    Hgb A1C Lab Results  Component Value Date   HGBA1C 5.7 (H) 05/17/2021           Assessment & Plan:     RTC in 6 months, follow-up chronic conditions Nicki Reaper, NP

## 2022-02-10 ENCOUNTER — Encounter: Payer: Self-pay | Admitting: Internal Medicine

## 2022-03-05 ENCOUNTER — Other Ambulatory Visit: Payer: Self-pay | Admitting: Internal Medicine

## 2022-03-05 DIAGNOSIS — I10 Essential (primary) hypertension: Secondary | ICD-10-CM

## 2022-03-06 NOTE — Telephone Encounter (Signed)
Requested Prescriptions  Pending Prescriptions Disp Refills  . lisinopril (ZESTRIL) 10 MG tablet [Pharmacy Med Name: Lisinopril 10 MG Oral Tablet] 90 tablet 1    Sig: TAKE 1 TABLET BY MOUTH DAILY     Cardiovascular:  ACE Inhibitors Failed - 03/05/2022 11:50 PM      Failed - Cr in normal range and within 180 days    Creatinine, Ser  Date Value Ref Range Status  05/17/2021 0.79 0.57 - 1.00 mg/dL Final         Failed - K in normal range and within 180 days    Potassium  Date Value Ref Range Status  05/17/2021 4.3 3.5 - 5.2 mmol/L Final         Passed - Patient is not pregnant      Passed - Last BP in normal range    BP Readings from Last 1 Encounters:  02/07/22 130/86         Passed - Valid encounter within last 6 months    Recent Outpatient Visits          3 weeks ago Essential hypertension   [place], Coralie Keens, NP   9 months ago Encounter for general adult medical examination with abnormal findings   [facility] [place], Coralie Keens, NP   1 year ago Essential hypertension   [place], Coralie Keens, NP   1 year ago Chronic pain of left knee   [place], DO   2 years ago Abnormal laboratory test result   [facility], Lupita Raider, FNP

## 2022-07-29 ENCOUNTER — Other Ambulatory Visit: Payer: Self-pay | Admitting: Internal Medicine

## 2022-07-29 DIAGNOSIS — I10 Essential (primary) hypertension: Secondary | ICD-10-CM

## 2022-07-30 NOTE — Telephone Encounter (Signed)
Requested Prescriptions  Pending Prescriptions Disp Refills   lisinopril (ZESTRIL) 10 MG tablet [Pharmacy Med Name: Lisinopril 10 MG Oral Tablet] 90 tablet 1    Sig: TAKE 1 TABLET BY MOUTH DAILY     Cardiovascular:  ACE Inhibitors Failed - 07/29/2022 10:50 PM      Failed - Cr in normal range and within 180 days    Creatinine, Ser  Date Value Ref Range Status  05/17/2021 0.79 0.57 - 1.00 mg/dL Final         Failed - K in normal range and within 180 days    Potassium  Date Value Ref Range Status  05/17/2021 4.3 3.5 - 5.2 mmol/L Final         Passed - Patient is not pregnant      Passed - Last BP in normal range    BP Readings from Last 1 Encounters:  02/07/22 130/86         Passed - Valid encounter within last 6 months    Recent Outpatient Visits           5 months ago Essential hypertension   [place], Coralie Keens, NP   1 year ago Encounter for general adult medical examination with abnormal findings   [facility] [place], Coralie Keens, NP   1 year ago Essential hypertension   [facility] [place], Coralie Keens, NP   2 years ago Chronic pain of left knee   [place], DO   2 years ago Abnormal laboratory test result   [facility] [place], Lupita Raider, [place]

## 2022-10-02 ENCOUNTER — Encounter: Payer: Self-pay | Admitting: Obstetrics and Gynecology

## 2022-10-02 DIAGNOSIS — N939 Abnormal uterine and vaginal bleeding, unspecified: Secondary | ICD-10-CM

## 2022-10-20 ENCOUNTER — Ambulatory Visit: Payer: Managed Care, Other (non HMO)

## 2022-10-20 ENCOUNTER — Telehealth: Payer: Self-pay | Admitting: Obstetrics and Gynecology

## 2022-10-20 DIAGNOSIS — N939 Abnormal uterine and vaginal bleeding, unspecified: Secondary | ICD-10-CM

## 2022-10-20 NOTE — Telephone Encounter (Signed)
Pt aware of GYN u/s results for AUB and thickened EM. Will RTO for EMB with subsequent progesterone Rx. Appt 10/28/22 at 9:35 AM

## 2022-10-27 NOTE — Progress Notes (Unsigned)
Lorre Munroe, NP   No chief complaint on file.   HPI:      [name] is a 52 y.o. G1P1001 whose LMP was No LMP recorded. (Menstrual status: Irregular Periods)., presents today for ***    Patient Active Problem List   Diagnosis Date Noted   Prediabetes 02/07/2022   Morbid obesity (HCC) 05/17/2021   GERD (gastroesophageal reflux disease) 09/15/2020   Chronic pain of left knee 09/15/2020   Mixed hyperlipidemia 09/24/2018   Essential hypertension 09/24/2018    Past Surgical History:  Procedure Laterality Date   COLONOSCOPY WITH PROPOFOL N/A 03/01/2021   Procedure: COLONOSCOPY WITH PROPOFOL;  Surgeon: Wyline Mood, MD;  Location: [facility] ENDOSCOPY;  Service: Gastroenterology;  Laterality: N/A;   MOUTH SURGERY     OTHER SURGICAL HISTORY  03/2021   miniscus surgery   WISDOM TOOTH EXTRACTION      Family History  Problem Relation Age of Onset   Breast cancer Maternal Grandmother 60   Colon cancer Maternal Grandmother 62   Liver cancer Maternal Grandmother    Hypertension Mother    Diabetes Mother    Hypertension Father    Diabetes Father    Heart disease Father    Heart attack Father    Obesity Father    Hypertension Sister    Dementia Maternal Grandfather    Heart attack Paternal Grandfather     Social History   Socioeconomic History   Marital status: Divorced    Spouse name: Not on file   Number of children: Not on file   Years of education: Not on file   Highest education level: Some college, no degree  Occupational History   Not on file  Tobacco Use   Smoking status: Never   Smokeless tobacco: Never  Vaping Use   Vaping Use: Never used  Substance and Sexual Activity   Alcohol use: Yes    Comment: occassionally    Drug use: No   Sexual activity: Not Currently    Birth control/protection: None  Other Topics Concern   Not on file  Social History Narrative   Not on file   Social Determinants of Health   Financial Resource Strain: Low  Risk  (02/05/2018)   Overall Financial Resource Strain (CARDIA)    Difficulty of Paying Living Expenses: Not hard at all  Food Insecurity: No Food Insecurity (02/05/2018)   Hunger Vital Sign    Worried About Running Out of Food in the Last Year: Never true    Ran Out of Food in the Last Year: Never true  Transportation Needs: No Transportation Needs (02/05/2018)   PRAPARE - Administrator, Civil Service (Medical): No    Lack of Transportation (Non-Medical): No  Physical Activity: Sufficiently Active (09/28/2017)   Exercise Vital Sign    Days of Exercise per Week: 3 days    Minutes of Exercise per Session: 120 min  Stress: No Stress Concern Present (02/05/2018)   [employer] of Occupational Health - Occupational Stress Questionnaire    Feeling of Stress : Only a little  Social Connections: Moderately Isolated (02/05/2018)   Social Connection and Isolation Panel [NHANES]    Frequency of Communication with Friends and Family: Twice a week    Frequency of Social Gatherings with Friends and Family: Twice a week    Attends Religious Services: Never    Database administrator or Organizations: No    Attends Banker Meetings: Never  Marital Status: Divorced  Catering manager Violence: Not At Risk (02/05/2018)   Humiliation, Afraid, Rape, and Kick questionnaire    Fear of Current or Ex-Partner: No    Emotionally Abused: No    Physically Abused: No    Sexually Abused: No    Outpatient Medications Prior to Visit  Medication Sig Dispense Refill   b complex vitamins tablet Take 1 tablet by mouth daily.     Cholecalciferol (VITAMIN D3) 10 MCG (400 UNIT) CAPS Take by mouth.     lisinopril (ZESTRIL) 10 MG tablet TAKE 1 TABLET BY MOUTH DAILY 90 tablet 1   loratadine (CLARITIN) 10 MG tablet Take 10 mg by mouth daily as needed.      Multiple Vitamin (MULTIVITAMIN) tablet Take 1 tablet by mouth every other day.      OMEPRAZOLE PO Take 10 mg by mouth as needed.      No  facility-administered medications prior to visit.      ROS:  Review of Systems BREAST: No symptoms   OBJECTIVE:   Vitals:  There were no vitals taken for this visit.  Physical Exam  Results: No results found for this or any previous visit (from the past 24 hour(s)).    Endometrial Biopsy After discussion with the patient regarding her abnormal uterine bleeding I recommended that she proceed with an endometrial biopsy for further diagnosis. The risks, benefits, alternatives, and indications for an endometrial biopsy were discussed with the patient in detail. She understood the risks including infection, bleeding, cervical laceration and uterine perforation.  Verbal consent was obtained.   PROCEDURE NOTE:  [place] endometrial biopsy was performed using aseptic technique with iodine preparation.  The uterus was sounded to a length of *** cm.  Adequate sampling {Actions; was/was not:31712} obtained with minimal blood loss.  The patient tolerated the procedure well.  Disposition will be pending pathology.  Haroon Shatto B. Haniya Fern, PA-C [facility], [facility] 10/27/2022  5:14 PM  Assessment/Plan: No diagnosis found.    No orders of the defined types were placed in this encounter.     No follow-ups on file.  Adriano Bischof B. Wenzel Backlund, PA-C 10/27/2022 5:14 PM

## 2022-10-28 ENCOUNTER — Encounter: Payer: Self-pay | Admitting: Obstetrics and Gynecology

## 2022-10-28 ENCOUNTER — Ambulatory Visit: Payer: Managed Care, Other (non HMO) | Admitting: Obstetrics and Gynecology

## 2022-10-28 ENCOUNTER — Other Ambulatory Visit: Payer: Self-pay | Admitting: Obstetrics and Gynecology

## 2022-10-28 VITALS — BP 128/90 | Ht 61.5 in | Wt 212.0 lb

## 2022-10-28 DIAGNOSIS — N939 Abnormal uterine and vaginal bleeding, unspecified: Secondary | ICD-10-CM

## 2022-10-28 DIAGNOSIS — R9389 Abnormal findings on diagnostic imaging of other specified body structures: Secondary | ICD-10-CM | POA: Diagnosis not present

## 2022-10-28 NOTE — Patient Instructions (Signed)
I value your feedback and you entrusting us with your care. If you get a [facility] patient survey, I would appreciate you taking the time to let us know about your experience today. Thank you! ? ? ?

## 2022-10-31 LAB — CBC WITH DIFFERENTIAL/PLATELET
Basophils Absolute: 0 10*3/uL (ref 0.0–0.2)
Basos: 0 %
EOS (ABSOLUTE): 0.1 10*3/uL (ref 0.0–0.4)
Eos: 1 %
Hematocrit: 38.8 % (ref 34.0–46.6)
Hemoglobin: 12.8 g/dL (ref 11.1–15.9)
Immature Grans (Abs): 0.1 10*3/uL (ref 0.0–0.1)
Immature Granulocytes: 1 %
Lymphocytes Absolute: 2.9 10*3/uL (ref 0.7–3.1)
Lymphs: 28 %
MCH: 29.4 pg (ref 26.6–33.0)
MCHC: 33 g/dL (ref 31.5–35.7)
MCV: 89 fL (ref 79–97)
Monocytes Absolute: 0.7 10*3/uL (ref 0.1–0.9)
Monocytes: 6 %
Neutrophils Absolute: 6.6 10*3/uL (ref 1.4–7.0)
Neutrophils: 64 %
Platelets: 405 10*3/uL (ref 150–450)
RBC: 4.35 x10E6/uL (ref 3.77–5.28)
RDW: 11.9 % (ref 11.7–15.4)
WBC: 10.4 10*3/uL (ref 3.4–10.8)

## 2022-10-31 LAB — TSH+FREE T4
Free T4: 0.93 ng/dL (ref 0.82–1.77)
TSH: 2.51 u[IU]/mL (ref 0.450–4.500)

## 2022-11-25 ENCOUNTER — Other Ambulatory Visit: Payer: Self-pay | Admitting: Obstetrics and Gynecology

## 2022-11-25 DIAGNOSIS — Z1231 Encounter for screening mammogram for malignant neoplasm of breast: Secondary | ICD-10-CM

## 2022-12-19 LAB — LIPID PANEL
Cholesterol: 200 (ref 0–200)
HDL: 36 (ref 35–70)
LDL Cholesterol: 102
Triglycerides: 366 — AB (ref 40–160)

## 2022-12-19 LAB — HEMOGLOBIN A1C: Hemoglobin A1C: 5.9

## 2022-12-23 ENCOUNTER — Other Ambulatory Visit: Payer: Self-pay | Admitting: Internal Medicine

## 2022-12-23 DIAGNOSIS — I10 Essential (primary) hypertension: Secondary | ICD-10-CM

## 2022-12-24 NOTE — Telephone Encounter (Signed)
Requested medication (s) are due for refill today: Yes  Requested medication (s) are on the active medication list: Yes  Last refill:  07/30/22  Future visit scheduled: No  Notes to clinic:  Unable to refill per protocol due to failed labs, no updated results. CPE needed     Requested Prescriptions  Pending Prescriptions Disp Refills   lisinopril (ZESTRIL) 10 MG tablet [Pharmacy Med Name: Lisinopril 10 MG Oral Tablet] 90 tablet 3    Sig: TAKE 1 TABLET BY MOUTH DAILY     Cardiovascular:  ACE Inhibitors Failed - 12/23/2022  1:52 AM      Failed - Cr in normal range and within 180 days    Creatinine, Ser  Date Value Ref Range Status  05/17/2021 0.79 0.57 - 1.00 mg/dL Final         Failed - K in normal range and within 180 days    Potassium  Date Value Ref Range Status  05/17/2021 4.3 3.5 - 5.2 mmol/L Final         Failed - Last BP in normal range    BP Readings from Last 1 Encounters:  10/28/22 (!) 128/90         Failed - Valid encounter within last 6 months    Recent Outpatient Visits           10 months ago Essential hypertension   [facility] [place], Salvadore Oxford, NP   1 year ago Encounter for general adult medical examination with abnormal findings   [place] [facility] [place], Salvadore Oxford, NP   2 years ago Essential hypertension   [place] [facility] [place], Salvadore Oxford, NP   2 years ago Chronic pain of left knee   [facility] [place] Cords, DO   3 years ago Abnormal laboratory test result    [facility], Jodelle Gross, [place]              Passed - Patient is not pregnant

## 2022-12-24 NOTE — Telephone Encounter (Signed)
Patient called, left VM to return the call to the office to schedule an OV for follow up. Will need CPE scheduled.

## 2022-12-26 ENCOUNTER — Ambulatory Visit
Admission: RE | Admit: 2022-12-26 | Discharge: 2022-12-26 | Disposition: A | Discharge status: Home / Self Care | Payer: Managed Care, Other (non HMO) | Source: Ambulatory Visit | Attending: Obstetrics and Gynecology | Admitting: Obstetrics and Gynecology

## 2022-12-26 DIAGNOSIS — Z1231 Encounter for screening mammogram for malignant neoplasm of breast: Secondary | ICD-10-CM | POA: Diagnosis not present

## 2023-01-30 ENCOUNTER — Encounter: Payer: Self-pay | Admitting: Internal Medicine

## 2023-01-30 ENCOUNTER — Ambulatory Visit: Payer: Managed Care, Other (non HMO) | Admitting: Internal Medicine

## 2023-01-30 VITALS — BP 126/84 | HR 92 | Ht 61.5 in | Wt 214.0 lb

## 2023-01-30 DIAGNOSIS — Z0001 Encounter for general adult medical examination with abnormal findings: Secondary | ICD-10-CM

## 2023-01-30 DIAGNOSIS — Z6839 Body mass index (BMI) 39.0-39.9, adult: Secondary | ICD-10-CM

## 2023-01-30 NOTE — Patient Instructions (Signed)

## 2023-01-30 NOTE — Assessment & Plan Note (Signed)
Encouraged diet and exercise for weight loss ?

## 2023-01-30 NOTE — Progress Notes (Signed)
Subjective:    Patient ID: Tina Hurley, female    DOB: 14-Feb-1971, 52 y.o.   MRN: [id number]  HPI  Pt presents to the clinic today for her annual exam.  Flu: never Tetanus: > 10 years ago Covid: never Shingrix: 2024 [facility] Pap smear: 01/2022 Mammogram: 12/2022 Colon screening: 02/2021 Vision screening: annually Dentist: biannually  Diet: She does eat meat. She consumes fruits and veggies. She tries to avoid fried foods. She drinks mostly water, dt. Pepsi, sweet tea. Exercise: pickleball, body weight exercise  Review of Systems     Past Medical History:  Diagnosis Date   Allergy    Hypertension    Menstrual migraine     Current Outpatient Medications  Medication Sig Dispense Refill   b complex vitamins tablet Take 1 tablet by mouth daily. (Patient not taking: Reported on 10/28/2022)     Cholecalciferol (VITAMIN D3) 10 MCG (400 UNIT) CAPS Take by mouth.     lisinopril (ZESTRIL) 10 MG tablet TAKE 1 TABLET BY MOUTH DAILY 90 tablet 0   loratadine (CLARITIN) 10 MG tablet Take 10 mg by mouth daily as needed.      Multiple Vitamin (MULTIVITAMIN) tablet Take 1 tablet by mouth every other day.      OMEPRAZOLE PO Take 10 mg by mouth as needed.      No current facility-administered medications for this visit.    No Known Allergies  Family History  Problem Relation Age of Onset   Breast cancer Maternal Grandmother 55   Colon cancer Maternal Grandmother 62   Liver cancer Maternal Grandmother    Hypertension Mother    Diabetes Mother    Hypertension Father    Diabetes Father    Heart disease Father    Heart attack Father    Obesity Father    Hypertension Sister    Dementia Maternal Grandfather    Heart attack Paternal Grandfather     Social History   Socioeconomic History   Marital status: Divorced    Spouse name: Not on file   Number of children: Not on file   Years of education: Not on file   Highest education level: Some college, no degree  Occupational  History   Not on file  Tobacco Use   Smoking status: Never   Smokeless tobacco: Never  Vaping Use   Vaping status: Never Used  Substance and Sexual Activity   Alcohol use: Yes    Comment: occassionally    Drug use: No   Sexual activity: Not Currently    Birth control/protection: None  Other Topics Concern   Not on file  Social History Narrative   Not on file   Social Determinants of Health   Financial Resource Strain: Low Risk  (01/27/2023)   Overall Financial Resource Strain (CARDIA)    Difficulty of Paying Living Expenses: Not hard at all  Food Insecurity: No Food Insecurity (01/27/2023)   Hunger Vital Sign    Worried About Running Out of Food in the Last Year: Never true    Ran Out of Food in the Last Year: Never true  Transportation Needs: No Transportation Needs (01/27/2023)   PRAPARE - Administrator, Civil Service (Medical): No    Lack of Transportation (Non-Medical): No  Physical Activity: Insufficiently Active (01/27/2023)   Exercise Vital Sign    Days of Exercise per Week: 3 days    Minutes of Exercise per Session: 20 min  Stress: No Stress Concern Present (01/27/2023)  [employer] of Occupational Health - Occupational Stress Questionnaire    Feeling of Stress : Not at all  Social Connections: Moderately Isolated (01/27/2023)   Social Connection and Isolation Panel [NHANES]    Frequency of Communication with Friends and Family: More than three times a week    Frequency of Social Gatherings with Friends and Family: Twice a week    Attends Religious Services: 1 to 4 times per year    Active Member of [employer] or Organizations: No    Attends Engineer, structural: Not on file    Marital Status: Divorced  Intimate Partner Violence: Not At Risk (02/05/2018)   Humiliation, Afraid, Rape, and Kick questionnaire    Fear of Current or Ex-Partner: No    Emotionally Abused: No    Physically Abused: No    Sexually Abused: No     Constitutional:  Denies fever, malaise, fatigue, headache or abrupt weight changes.  HEENT: Denies eye pain, eye redness, ear pain, ringing in the ears, wax buildup, runny nose, nasal congestion, bloody nose, or sore throat. Respiratory: Denies difficulty breathing, shortness of breath, cough or sputum production.   Cardiovascular: Denies chest pain, chest tightness, palpitations or swelling in the hands or feet.  Gastrointestinal: Pt reports intermittent reflux. Denies abdominal pain, bloating, constipation, diarrhea or blood in the stool.  GU: Denies urgency, frequency, pain with urination, burning sensation, blood in urine, odor or discharge. Musculoskeletal: Pt reports knee pain. Denies decrease in range of motion, difficulty with gait, muscle pain or joint swelling.  Skin: Denies redness, rashes, lesions or ulcercations.  Neurological: Denies dizziness, difficulty with memory, difficulty with speech or problems with balance and coordination.  Psych: Denies anxiety, depression, SI/HI.  No other specific complaints in a complete review of systems (except as listed in HPI above).  Objective:   Physical Exam BP 126/84 (BP Location: Left Arm, Patient Position: Sitting, Cuff Size: Normal)   Pulse 92   Ht 5' 1.5" (1.562 m)   Wt 214 lb (97.1 kg)   SpO2 96%   BMI 39.78 kg/m   Wt Readings from Last 3 Encounters:  10/28/22 212 lb (96.2 kg)  02/07/22 219 lb (99.3 kg)  01/30/22 221 lb (100.2 kg)    General: Appears her stated age, obese, in NAD. Skin: Warm, dry and intact.  HEENT: Head: normal shape and size; Eyes: sclera white, no icterus, conjunctiva pink, PERRLA and EOMs intact;  Neck:  Neck supple, trachea midline. No masses, lumps or thyromegaly present.  Cardiovascular: Normal rate and rhythm. S1,S2 noted.  No murmur, rubs or gallops noted. No JVD or BLE edema. No carotid bruits noted. Pulmonary/Chest: Normal effort and positive vesicular breath sounds. No respiratory distress. No wheezes, rales or  ronchi noted.  Abdomen: Normal bowel sounds.  Musculoskeletal: Strength 5/5 BUE/BLE. No difficulty with gait.  Neurological: Alert and oriented. Cranial nerves II-XII grossly intact. Coordination normal.  Psychiatric: Mood and affect normal. Behavior is normal. Judgment and thought content normal.    BMET    Component Value Date/Time   NA 139 05/17/2021 1329   K 4.3 05/17/2021 1329   CL 103 05/17/2021 1329   CO2 17 (L) 05/17/2021 1329   GLUCOSE 120 (H) 05/17/2021 1329   BUN 14 05/17/2021 1329   CREATININE 0.79 05/17/2021 1329   CALCIUM 9.6 05/17/2021 1329   GFRNONAA 87 09/16/2019 1155   GFRAA 100 09/16/2019 1155    Lipid Panel     Component Value Date/Time   CHOL 181 05/17/2021 1329  TRIG 462 (H) 05/17/2021 1329   HDL 32 (L) 05/17/2021 1329   CHOLHDL 5.7 (H) 05/17/2021 1329   LDLCALC 76 05/17/2021 1329    CBC    Component Value Date/Time   WBC 10.4 10/28/2022 1015   RBC 4.35 10/28/2022 1015   HGB 12.8 10/28/2022 1015   HCT 38.8 10/28/2022 1015   PLT 405 10/28/2022 1015   MCV 89 10/28/2022 1015   MCH 29.4 10/28/2022 1015   MCHC 33.0 10/28/2022 1015   RDW 11.9 10/28/2022 1015   LYMPHSABS 2.9 10/28/2022 1015   EOSABS 0.1 10/28/2022 1015   BASOSABS 0.0 10/28/2022 1015    Hgb A1C Lab Results  Component Value Date   HGBA1C 5.7 (H) 05/17/2021             Assessment & Plan:   Preventative health maintenance:  Flu shot declined Tetanus declined Encouraged her to get her COVID-vaccine Discussed Shingrix vaccine, she will check coverage with her insurance company and schedule visit she would like to have this done Pap smear UTD Mammogram UTD Colon screening UTD Encouraged her to consume a balanced diet and exercise regimen Advised her to see an eye doctor and dentist annually Will check CBC, c-Met. She will send me a copy of her recent biometric screening  RTC in 6 months, follow-up chronic conditions Nicki Reaper, NP

## 2023-02-02 ENCOUNTER — Encounter: Payer: Self-pay | Admitting: Internal Medicine

## 2023-02-03 NOTE — Telephone Encounter (Signed)
Please abstract Tina Hurley, Tina Hurley and Tina Hurley

## 2023-02-04 ENCOUNTER — Encounter: Payer: Self-pay | Admitting: Internal Medicine

## 2023-02-09 NOTE — Progress Notes (Unsigned)
No chief complaint on file.    HPI:      [name] is a 52 y.o. G1P1001 who LMP was No LMP recorded. (Menstrual status: Irregular Periods)., presents today for her annual examination. Her menses are usually monthly, lasting 4-5 days, mod flow, but has skipped a few months occas. Dysmenorrhea none. No BTB, no vasomotor sx. Was on OCPs for cycle control but had to stop due to dx of HTN. Tried POPs but cycles irregular, so pt stopped them.    AUB f/u with EMB. Has been having BTB and menorrhagia the past few months. Gyn u/s 5/24 showed the Endometrium measures 39.98 mm. Extremely thickened and heterogenous. Menses are usually monthly, lasting 5-7 days, super heavy a few days, with large clots, soiling clothes. No real dysmen but has some pelvic aching at times. Was on OCPs for cycle control in past but had to stop due to dx of HTN. Tried POPs but cycles irregular, so pt stopped them.   NEG EMB bx 6/24???   Sex activity: not sexually active. No vag sx.  Last Pap: 01/30/22 Results were: no abnormalities /neg HPV DNA 2019.  Pt likes yearly paps, works at [employer].  Hx of STDs: none   Last mammogram: 12/26/22 Results were: normal--routine follow-up in 12 months There is a FH of breast cancer in her MGM and mat grt aunt, genetic testing not indicated. There is no FH of ovarian cancer. The patient does do self-breast exams.   Tobacco use: The patient denies current or previous tobacco use. Alcohol use: social drinker No drug use Exercise: mod active, plays pickleball  Colonoscopy: 10/22 with [facility]; repeat due 10 yrs   She does get adequate calcium and Vitamin D in her diet.   Labs with PCP/work.   Past Medical History:  Diagnosis Date   Allergy    Hypertension    Menstrual migraine     Past Surgical History:  Procedure Laterality Date   COLONOSCOPY WITH PROPOFOL N/A 03/01/2021   Procedure: COLONOSCOPY WITH PROPOFOL;  Surgeon: Wyline Mood, MD;  [facility] ENDOSCOPY;   Service: Gastroenterology;  Laterality: N/A;   MOUTH SURGERY     OTHER SURGICAL HISTORY  03/2021   miniscus surgery   WISDOM TOOTH EXTRACTION      Family History  Problem Relation Age of Onset   Breast cancer Maternal Grandmother 60   Colon cancer Maternal Grandmother 62   Liver cancer Maternal Grandmother    Hypertension Mother    Diabetes Mother    Hypertension Father    Diabetes Father    Heart disease Father    Heart attack Father    Obesity Father    Hypertension Sister    Dementia Maternal Grandfather    Heart attack Paternal Grandfather     Social History   Socioeconomic History   Marital status: Divorced    Spouse name: Not on file   Number of children: Not on file   Years of education: Not on file   Highest education level: Some college, no degree  Occupational History   Not on file  Tobacco Use   Smoking status: Never   Smokeless tobacco: Never  Vaping Use   Vaping status: Never Used  Substance and Sexual Activity   Alcohol use: Yes    Comment: occassionally    Drug use: No   Sexual activity: Not Currently    Birth control/protection: None  Other Topics Concern   Not on file  Social  History Narrative   Not on file   Social Determinants of Health   Financial Resource Strain: Low Risk  (01/27/2023)   Overall Financial Resource Strain (CARDIA)    Difficulty of Paying Living Expenses: Not hard at all  Food Insecurity: No Food Insecurity (01/27/2023)   Hunger Vital Sign    Worried About Running Out of Food in the Last Year: Never true    Ran Out of Food in the Last Year: Never true  Transportation Needs: No Transportation Needs (01/27/2023)   PRAPARE - Administrator, Civil Service (Medical): No    Lack of Transportation (Non-Medical): No  Physical Activity: Insufficiently Active (01/27/2023)   Exercise Vital Sign    Days of Exercise per Week: 3 days    Minutes of Exercise per Session: 20 min  Stress: No Stress Concern Present (01/27/2023)    [employer] of Occupational Health - Occupational Stress Questionnaire    Feeling of Stress : Not at all  Social Connections: Moderately Isolated (01/27/2023)   Social Connection and Isolation Panel [NHANES]    Frequency of Communication with Friends and Family: More than three times a week    Frequency of Social Gatherings with Friends and Family: Twice a week    Attends Religious Services: 1 to 4 times per year    Active Member of [employer] or Organizations: No    Attends Engineer, structural: Not on file    Marital Status: Divorced  Intimate Partner Violence: Not At Risk (02/05/2018)   Humiliation, Afraid, Rape, and Kick questionnaire    Fear of Current or Ex-Partner: No    Emotionally Abused: No    Physically Abused: No    Sexually Abused: No    Current Outpatient Medications on File Prior to Visit  Medication Sig Dispense Refill   b complex vitamins tablet Take 1 tablet by mouth daily.     Cholecalciferol (VITAMIN D3) 10 MCG (400 UNIT) CAPS Take by mouth.     lisinopril (ZESTRIL) 10 MG tablet TAKE 1 TABLET BY MOUTH DAILY 90 tablet 0   loratadine (CLARITIN) 10 MG tablet Take 10 mg by mouth daily as needed.      Multiple Vitamin (MULTIVITAMIN) tablet Take 1 tablet by mouth every other day.      OMEPRAZOLE PO Take 10 mg by mouth as needed.      No current facility-administered medications on file prior to visit.    ROS:  Review of Systems  Constitutional:  Negative for fatigue, fever and unexpected weight change.  Respiratory:  Negative for cough, shortness of breath and wheezing.   Cardiovascular:  Negative for chest pain, palpitations and leg swelling.  Gastrointestinal:  Negative for blood in stool, constipation, diarrhea, nausea and vomiting.  Endocrine: Negative for cold intolerance, heat intolerance and polyuria.  Genitourinary:  Negative for dyspareunia, dysuria, flank pain, frequency, genital sores, hematuria, menstrual problem, pelvic pain, urgency,  vaginal bleeding, vaginal discharge and vaginal pain.  Musculoskeletal:  Positive for arthralgias and joint swelling. Negative for back pain and myalgias.  Skin:  Negative for rash.  Neurological:  Negative for dizziness, syncope, light-headedness, numbness and headaches.  Hematological:  Negative for adenopathy.  Psychiatric/Behavioral:  Negative for agitation, confusion, sleep disturbance and suicidal ideas. The patient is not nervous/anxious.      Objective: There were no vitals taken for this visit.   Physical Exam Constitutional:      Appearance: She is well-developed.  Genitourinary:     Vulva normal.  Right Labia: No rash, tenderness or lesions.    Left Labia: No tenderness, lesions or rash.    No vaginal discharge, erythema or tenderness.      Right Adnexa: not tender and no mass present.    Left Adnexa: not tender and no mass present.    No cervical motion tenderness, friability or polyp.     Uterus is not enlarged or tender.  Breasts:    Right: No mass, nipple discharge, skin change or tenderness.     Left: No mass, nipple discharge, skin change or tenderness.  Neck:     Thyroid: No thyromegaly.  Cardiovascular:     Rate and Rhythm: Normal rate and regular rhythm.     Heart sounds: Normal heart sounds. No murmur heard. Pulmonary:     Effort: Pulmonary effort is normal.     Breath sounds: Normal breath sounds.  Abdominal:     Palpations: Abdomen is soft.     Tenderness: There is no abdominal tenderness. There is no guarding or rebound.  Musculoskeletal:        General: Normal range of motion.     Cervical back: Normal range of motion.  Lymphadenopathy:     Cervical: No cervical adenopathy.  Neurological:     General: No focal deficit present.     Mental Status: She is alert and oriented to person, place, and time.     Cranial Nerves: No cranial nerve deficit.  Skin:    General: Skin is warm and dry.  Psychiatric:        Mood and Affect: Mood normal.         Behavior: Behavior normal.        Thought Content: Thought content normal.        Judgment: Judgment normal.  Vitals reviewed.     Assessment/Plan: Encounter for annual routine gynecological examination  Cervical cancer screening - Plan: IGP, Aptima HPV  Screening for HPV (human papillomavirus) - Plan: IGP, Aptima HPV  Encounter for screening mammogram for malignant neoplasm of breast; pt current on mammo            GYN counsel breast self exam, mammography screening, adequate intake of calcium and vitamin D, diet and exercise     F/U  No follow-ups on file.  Kaidyn Javid B. Dryden Tapley, PA-C 02/09/2023 5:28 PM

## 2023-02-10 ENCOUNTER — Ambulatory Visit (INDEPENDENT_AMBULATORY_CARE_PROVIDER_SITE_OTHER): Payer: Managed Care, Other (non HMO) | Admitting: Obstetrics and Gynecology

## 2023-02-10 ENCOUNTER — Encounter: Payer: Self-pay | Admitting: Obstetrics and Gynecology

## 2023-02-10 VITALS — BP 124/84 | Ht 61.5 in | Wt 214.0 lb

## 2023-02-10 DIAGNOSIS — Z1151 Encounter for screening for human papillomavirus (HPV): Secondary | ICD-10-CM

## 2023-02-10 DIAGNOSIS — N939 Abnormal uterine and vaginal bleeding, unspecified: Secondary | ICD-10-CM

## 2023-02-10 DIAGNOSIS — Z01419 Encounter for gynecological examination (general) (routine) without abnormal findings: Secondary | ICD-10-CM

## 2023-02-10 DIAGNOSIS — Z1231 Encounter for screening mammogram for malignant neoplasm of breast: Secondary | ICD-10-CM

## 2023-02-10 DIAGNOSIS — Z124 Encounter for screening for malignant neoplasm of cervix: Secondary | ICD-10-CM

## 2023-02-10 NOTE — Patient Instructions (Signed)
I value your feedback and you entrusting us with your care. If you get a [facility] patient survey, I would appreciate you taking the time to let us know about your experience today. Thank you! ? ? ?

## 2023-02-11 LAB — COMPREHENSIVE METABOLIC PANEL
ALT: 18 [IU]/L (ref 0–32)
AST: 20 [IU]/L (ref 0–40)
Albumin: 4.3 g/dL (ref 3.8–4.9)
Alkaline Phosphatase: 83 [IU]/L (ref 44–121)
BUN/Creatinine Ratio: 13 (ref 9–23)
BUN: 11 mg/dL (ref 6–24)
Bilirubin Total: 0.3 mg/dL (ref 0.0–1.2)
CO2: 23 mmol/L (ref 20–29)
Calcium: 10.2 mg/dL (ref 8.7–10.2)
Chloride: 101 mmol/L (ref 96–106)
Creatinine, Ser: 0.83 mg/dL (ref 0.57–1.00)
Globulin, Total: 2.8 g/dL (ref 1.5–4.5)
Glucose: 89 mg/dL (ref 70–99)
Potassium: 4.7 mmol/L (ref 3.5–5.2)
Sodium: 139 mmol/L (ref 134–144)
Total Protein: 7.1 g/dL (ref 6.0–8.5)
eGFR: 85 mL/min/{1.73_m2} (ref >59)

## 2023-02-11 LAB — CBC WITH DIFFERENTIAL/PLATELET
Basophils Absolute: 0.1 10*3/uL (ref 0.0–0.2)
Basos: 1 %
EOS (ABSOLUTE): 0.1 10*3/uL (ref 0.0–0.4)
Eos: 1 %
Hematocrit: 42.3 % (ref 34.0–46.6)
Hemoglobin: 13.8 g/dL (ref 11.1–15.9)
Immature Grans (Abs): 0 10*3/uL (ref 0.0–0.1)
Immature Granulocytes: 0 %
Lymphocytes Absolute: 3.9 10*3/uL — ABNORMAL HIGH (ref 0.7–3.1)
Lymphs: 35 %
MCH: 28.9 pg (ref 26.6–33.0)
MCHC: 32.6 g/dL (ref 31.5–35.7)
MCV: 89 fL (ref 79–97)
Monocytes Absolute: 0.7 10*3/uL (ref 0.1–0.9)
Monocytes: 6 %
Neutrophils Absolute: 6.4 10*3/uL (ref 1.4–7.0)
Neutrophils: 57 %
Platelets: 423 10*3/uL (ref 150–450)
RBC: 4.78 x10E6/uL (ref 3.77–5.28)
RDW: 12.5 % (ref 11.7–15.4)
WBC: 11.3 10*3/uL — ABNORMAL HIGH (ref 3.4–10.8)

## 2023-02-17 LAB — IGP, APTIMA HPV: HPV Aptima: NEGATIVE

## 2023-02-25 NOTE — Progress Notes (Signed)
The cells on the cervix are slightly atypical but unsure of the significance of it. This is a common pap smear finding. That is all the report generally says so I don't have any more specifics for her, other than what has already been said on results.

## 2023-02-28 ENCOUNTER — Other Ambulatory Visit: Payer: Self-pay | Admitting: Internal Medicine

## 2023-02-28 DIAGNOSIS — I10 Essential (primary) hypertension: Secondary | ICD-10-CM

## 2023-03-02 NOTE — Telephone Encounter (Signed)
Requested Prescriptions  Pending Prescriptions Disp Refills   lisinopril (ZESTRIL) 10 MG tablet [Pharmacy Med Name: Lisinopril 10 MG Oral Tablet] 90 tablet 1    Sig: TAKE 1 TABLET BY MOUTH DAILY     Cardiovascular:  ACE Inhibitors Passed - 02/28/2023 10:13 PM      Passed - Cr in normal range and within 180 days    Creatinine, Ser  Date Value Ref Range Status  02/10/2023 0.83 0.57 - 1.00 mg/dL Final         Passed - K in normal range and within 180 days    Potassium  Date Value Ref Range Status  02/10/2023 4.7 3.5 - 5.2 mmol/L Final         Passed - Patient is not pregnant      Passed - Last BP in normal range    BP Readings from Last 1 Encounters:  02/10/23 124/84         Passed - Valid encounter within last 6 months    Recent Outpatient Visits           1 month ago Encounter for general adult medical examination with abnormal findings   [place] [facility] [place], Salvadore Oxford, NP   1 year ago Essential hypertension   [place] [facility] [place], Salvadore Oxford, NP   1 year ago Encounter for general adult medical examination with abnormal findings   [place] [facility] [place], Salvadore Oxford, NP   2 years ago Essential hypertension   [place] [facility] [place], Salvadore Oxford, NP   2 years ago Chronic pain of left knee   [facility] Smitty Cords, DO       Future Appointments             In 5 months [place], Salvadore Oxford, NP [place] [facility]

## 2023-04-17 LAB — ANATOMIC PATHOLOGY REPORT

## 2023-07-23 ENCOUNTER — Telehealth: Payer: Self-pay

## 2023-07-23 DIAGNOSIS — Z0000 Encounter for general adult medical examination without abnormal findings: Secondary | ICD-10-CM

## 2023-07-23 NOTE — Addendum Note (Signed)
 Addended by: Lorre Munroe on: 07/23/2023 02:05 PM   Modules accepted: Orders

## 2023-07-23 NOTE — Telephone Encounter (Signed)
 Copied from CRM [id number]. Topic: Clinical - Request for Lab/Test Order >> Jul 23, 2023  1:34 PM Clayton Bibles wrote: Reason for CRM: Tina Hurley is off tomorrow and wants to get her labs done for her appointment on March 21. Please send lab orders to [facility] and she go tomorrow to complete.  Please call or leave message on MyChart when this is completed. Thanks

## 2023-07-23 NOTE — Telephone Encounter (Signed)
 Future labs ordered.

## 2023-07-24 ENCOUNTER — Other Ambulatory Visit: Payer: Self-pay | Admitting: Internal Medicine

## 2023-07-24 DIAGNOSIS — Z0000 Encounter for general adult medical examination without abnormal findings: Secondary | ICD-10-CM

## 2023-07-25 LAB — COMPREHENSIVE METABOLIC PANEL
ALT: 16 IU/L (ref 0–32)
AST: 19 IU/L (ref 0–40)
Albumin: 4.2 g/dL (ref 3.8–4.9)
Alkaline Phosphatase: 84 IU/L (ref 44–121)
BUN/Creatinine Ratio: 16 (ref 9–23)
BUN: 13 mg/dL (ref 6–24)
Bilirubin Total: 0.4 mg/dL (ref 0.0–1.2)
CO2: 22 mmol/L (ref 20–29)
Calcium: 10.1 mg/dL (ref 8.7–10.2)
Chloride: 103 mmol/L (ref 96–106)
Creatinine, Ser: 0.8 mg/dL (ref 0.57–1.00)
Globulin, Total: 2.6 g/dL (ref 1.5–4.5)
Glucose: 87 mg/dL (ref 70–99)
Potassium: 4.5 mmol/L (ref 3.5–5.2)
Sodium: 139 mmol/L (ref 134–144)
Total Protein: 6.8 g/dL (ref 6.0–8.5)
eGFR: 88 mL/min/{1.73_m2} (ref >59)

## 2023-07-25 LAB — CBC
Hematocrit: 41 % (ref 34.0–46.6)
Hemoglobin: 13.7 g/dL (ref 11.1–15.9)
MCH: 29.1 pg (ref 26.6–33.0)
MCHC: 33.4 g/dL (ref 31.5–35.7)
MCV: 87 fL (ref 79–97)
Platelets: 364 10*3/uL (ref 150–450)
RBC: 4.7 x10E6/uL (ref 3.77–5.28)
RDW: 12.4 % (ref 11.7–15.4)
WBC: 9 10*3/uL (ref 3.4–10.8)

## 2023-07-25 LAB — LIPID PANEL
Chol/HDL Ratio: 6.1 ratio — ABNORMAL HIGH (ref 0.0–4.4)
Cholesterol, Total: 207 mg/dL — ABNORMAL HIGH (ref 100–199)
HDL: 34 mg/dL — ABNORMAL LOW (ref >39)
LDL Chol Calc (NIH): 127 mg/dL — ABNORMAL HIGH (ref 0–99)
Triglycerides: 256 mg/dL — ABNORMAL HIGH (ref 0–149)
VLDL Cholesterol Cal: 46 mg/dL — ABNORMAL HIGH (ref 5–40)

## 2023-07-25 LAB — HEMOGLOBIN A1C
Est. average glucose Bld gHb Est-mCnc: 117 mg/dL
Hgb A1c MFr Bld: 5.7 % — ABNORMAL HIGH (ref 4.8–5.6)

## 2023-07-27 ENCOUNTER — Encounter: Payer: Self-pay | Admitting: Internal Medicine

## 2023-07-27 DIAGNOSIS — E782 Mixed hyperlipidemia: Secondary | ICD-10-CM

## 2023-07-28 ENCOUNTER — Other Ambulatory Visit: Payer: Self-pay | Admitting: Internal Medicine

## 2023-07-28 DIAGNOSIS — I10 Essential (primary) hypertension: Secondary | ICD-10-CM

## 2023-07-30 NOTE — Telephone Encounter (Signed)
 Requested medication (s) are due for refill today: yes   Requested medication (s) are on the active medication list: yes   Last refill:  03/02/23 #90 1 refill  Future visit scheduled: yes tomorrow  Notes to clinic:  To pharmacy: Please send a replace/new response with 90-Day Supply if appropriate to maximize member benefit. Requesting 1 year supply.  Do you want to refill Rx prior to OV tomorrow ?     Requested Prescriptions  Pending Prescriptions Disp Refills   lisinopril (ZESTRIL) 10 MG tablet [Pharmacy Med Name: Lisinopril 10 MG Oral Tablet] 90 tablet 3    Sig: TAKE 1 TABLET BY MOUTH DAILY     Cardiovascular:  ACE Inhibitors Failed - 07/30/2023  9:25 AM      Failed - Valid encounter within last 6 months    Recent Outpatient Visits           6 months ago Encounter for general adult medical examination with abnormal findings   [place] [facility] [place], Salvadore Oxford, NP   1 year ago Essential hypertension   [place] [facility] [place], Salvadore Oxford, NP   2 years ago Encounter for general adult medical examination with abnormal findings   [place] [facility] [place], Salvadore Oxford, NP   2 years ago Essential hypertension   [place] [facility] [employer], Salvadore Oxford, NP   3 years ago Chronic pain of left knee   [place] [facility] Smitty Cords, DO       Future Appointments             Tomorrow Lorre Munroe, NP [place] [facility]            Passed - Cr in normal range and within 180 days    Creatinine, Ser  Date Value Ref Range Status  07/24/2023 0.80 0.57 - 1.00 mg/dL Final         Passed - K in normal range and within 180 days    Potassium  Date Value Ref Range Status  07/24/2023 4.5 3.5 - 5.2 mmol/L Final         Passed - Patient is not pregnant      Passed - Last BP in normal range    BP Readings from Last 1 Encounters:  02/10/23 124/84

## 2023-07-31 ENCOUNTER — Ambulatory Visit: Payer: Managed Care, Other (non HMO) | Admitting: Internal Medicine

## 2023-07-31 ENCOUNTER — Encounter: Payer: Self-pay | Admitting: Internal Medicine

## 2023-07-31 VITALS — BP 122/84 | Ht 61.5 in | Wt 209.2 lb

## 2023-07-31 DIAGNOSIS — Z6838 Body mass index (BMI) 38.0-38.9, adult: Secondary | ICD-10-CM

## 2023-07-31 DIAGNOSIS — M25562 Pain in left knee: Secondary | ICD-10-CM

## 2023-07-31 DIAGNOSIS — E66812 Obesity, class 2: Secondary | ICD-10-CM | POA: Diagnosis not present

## 2023-07-31 DIAGNOSIS — I10 Essential (primary) hypertension: Secondary | ICD-10-CM

## 2023-07-31 DIAGNOSIS — K219 Gastro-esophageal reflux disease without esophagitis: Secondary | ICD-10-CM

## 2023-07-31 DIAGNOSIS — G8929 Other chronic pain: Secondary | ICD-10-CM

## 2023-07-31 DIAGNOSIS — E782 Mixed hyperlipidemia: Secondary | ICD-10-CM

## 2023-07-31 DIAGNOSIS — R7303 Prediabetes: Secondary | ICD-10-CM

## 2023-07-31 NOTE — Assessment & Plan Note (Addendum)
 Controlled on Lisinopril Reinforced DASH diet and exercise for weight loss CMET reviewed

## 2023-07-31 NOTE — Patient Instructions (Signed)

## 2023-07-31 NOTE — Assessment & Plan Note (Signed)
 CMET and lipid profile reviewed Encouraged low fat diet

## 2023-07-31 NOTE — Assessment & Plan Note (Signed)
 Encouraged low-carb diet and exercise for weight loss

## 2023-07-31 NOTE — Progress Notes (Signed)
 Subjective:    Patient ID: Tina Hurley, female    DOB: 1970/11/07, 53 y.o.   MRN: [id number]  HPI  Patient presents to clinic today for follow-up of chronic conditions.  HTN: Her BP today is 122/84.  She is taking lisinopril as prescribed.  ECG from 11/2020 reviewed.  HLD: Her last LDL was 127, triglycerides 256, 07/2023.  She is not taking any cholesterol-lowering medication at this time.  She tries to consume a low-fat diet.  GERD: Triggered by eating too fast, tomato-based foods.  She takes omeprazole as needed with good results.  There is no upper GI on file.  Chronic left knee pain: She intermittently gets cortisone injections. Managed with ibuprofen as needed.  She follows with orthopedics.  Prediabetes: Her last A1c was 5.7%, 07/2023.  She is not taking any oral diabetic medication at this time.  She does not check her sugars.  Review of Systems     Past Medical History:  Diagnosis Date   Allergy    Hypertension    Menstrual migraine     Current Outpatient Medications  Medication Sig Dispense Refill   b complex vitamins tablet Take 1 tablet by mouth daily.     Cholecalciferol (VITAMIN D3) 10 MCG (400 UNIT) CAPS Take by mouth.     lisinopril (ZESTRIL) 10 MG tablet TAKE 1 TABLET BY MOUTH DAILY 90 tablet 3   loratadine (CLARITIN) 10 MG tablet Take 10 mg by mouth daily as needed.      Multiple Vitamin (MULTIVITAMIN) tablet Take 1 tablet by mouth every other day.      OMEPRAZOLE PO Take 10 mg by mouth as needed.      No current facility-administered medications for this visit.    No Known Allergies  Family History  Problem Relation Age of Onset   Breast cancer Maternal Grandmother 43   Colon cancer Maternal Grandmother 62   Liver cancer Maternal Grandmother    Hypertension Mother    Diabetes Mother    Hypertension Father    Diabetes Father    Heart disease Father    Heart attack Father    Obesity Father    Hypertension Sister    Dementia Maternal  Grandfather    Heart attack Paternal Grandfather     Social History   Socioeconomic History   Marital status: Divorced    Spouse name: Not on file   Number of children: Not on file   Years of education: Not on file   Highest education level: Some college, no degree  Occupational History   Not on file  Tobacco Use   Smoking status: Never   Smokeless tobacco: Never  Vaping Use   Vaping status: Never Used  Substance and Sexual Activity   Alcohol use: Yes    Comment: occassionally    Drug use: No   Sexual activity: Not Currently    Birth control/protection: None  Other Topics Concern   Not on file  Social History Narrative   Not on file   Social Drivers of Health   Financial Resource Strain: Low Risk  (07/29/2023)   Overall Financial Resource Strain (CARDIA)    Difficulty of Paying Living Expenses: Not hard at all  Food Insecurity: No Food Insecurity (07/29/2023)   Hunger Vital Sign    Worried About Running Out of Food in the Last Year: Never true    Ran Out of Food in the Last Year: Never true  Transportation Needs: No Transportation Needs (07/29/2023)  PRAPARE - Administrator, Civil Service (Medical): No    Lack of Transportation (Non-Medical): No  Physical Activity: Insufficiently Active (07/29/2023)   Exercise Vital Sign    Days of Exercise per Week: 3 days    Minutes of Exercise per Session: 30 min  Stress: No Stress Concern Present (07/29/2023)   [employer] of Occupational Health - Occupational Stress Questionnaire    Feeling of Stress : Not at all  Social Connections: Moderately Isolated (07/29/2023)   Social Connection and Isolation Panel [NHANES]    Frequency of Communication with Friends and Family: More than three times a week    Frequency of Social Gatherings with Friends and Family: Twice a week    Attends Religious Services: 1 to 4 times per year    Active Member of [employer] or Organizations: No    Attends Engineer, structural: Not  on file    Marital Status: Divorced  Intimate Partner Violence: Not At Risk (02/05/2018)   Humiliation, Afraid, Rape, and Kick questionnaire    Fear of Current or Ex-Partner: No    Emotionally Abused: No    Physically Abused: No    Sexually Abused: No     Constitutional: Denies fever, malaise, fatigue, headache or abrupt weight changes.  HEENT: Denies eye pain, eye redness, ear pain, ringing in the ears, wax buildup, runny nose, nasal congestion, bloody nose, or sore throat. Respiratory: Denies difficulty breathing, shortness of breath, cough or sputum production.   Cardiovascular: Denies chest pain, chest tightness, palpitations or swelling in the hands or feet.  Gastrointestinal: Denies abdominal pain, bloating, constipation, diarrhea or blood in the stool.  GU: Denies urgency, frequency, pain with urination, burning sensation, blood in urine, odor or discharge. Musculoskeletal: Patient reports knee pain.  Denies decrease in range of motion, difficulty with gait, muscle pain or joint swelling.  Skin: Denies redness, rashes, lesions or ulcercations.  Neurological: Denies dizziness, difficulty with memory, difficulty with speech or problems with balance and coordination.  Psych: Denies anxiety, depression, SI/HI.  No other specific complaints in a complete review of systems (except as listed in HPI above).  Objective:   Physical Exam  BP 122/84 (BP Location: Left Arm, Patient Position: Sitting, Cuff Size: Normal)   Ht 5' 1.5" (1.562 m)   Wt 209 lb 3.2 oz (94.9 kg)   LMP  (LMP Unknown)   BMI 38.89 kg/m    Wt Readings from Last 3 Encounters:  02/10/23 214 lb (97.1 kg)  01/30/23 214 lb (97.1 kg)  10/28/22 212 lb (96.2 kg)    General: Appears her stated age, obese, in NAD. Skin: Warm, dry and intact.  HEENT: Head: normal shape and size; Eyes: sclera white, no icterus, conjunctiva pink, PERRLA and EOMs intact;  Cardiovascular: Normal rate and rhythm. S1,S2 noted.  No murmur,  rubs or gallops noted. No JVD or BLE edema. No carotid bruits noted. Pulmonary/Chest: Normal effort and positive vesicular breath sounds. No respiratory distress. No wheezes, rales or ronchi noted.  Abdomen: Normal bowel sounds. Musculoskeletal: No signs of joint swelling. No difficulty with gait.  Neurological: Alert and oriented.    BMET    Component Value Date/Time   NA 139 07/24/2023 0958   K 4.5 07/24/2023 0958   CL 103 07/24/2023 0958   CO2 22 07/24/2023 0958   GLUCOSE 87 07/24/2023 0958   BUN 13 07/24/2023 0958   CREATININE 0.80 07/24/2023 0958   CALCIUM 10.1 07/24/2023 0958   GFRNONAA 87 09/16/2019 1155  GFRAA 100 09/16/2019 1155    Lipid Panel     Component Value Date/Time   CHOL 207 (H) 07/24/2023 0958   TRIG 256 (H) 07/24/2023 0958   HDL 34 (L) 07/24/2023 0958   CHOLHDL 6.1 (H) 07/24/2023 0958   LDLCALC 127 (H) 07/24/2023 0958    CBC    Component Value Date/Time   WBC 9.0 07/24/2023 0958   RBC 4.70 07/24/2023 0958   HGB 13.7 07/24/2023 0958   HCT 41.0 07/24/2023 0958   PLT 364 07/24/2023 0958   MCV 87 07/24/2023 0958   MCH 29.1 07/24/2023 0958   MCHC 33.4 07/24/2023 0958   RDW 12.4 07/24/2023 0958   LYMPHSABS 3.9 (H) 02/10/2023 0915   EOSABS 0.1 02/10/2023 0915   BASOSABS 0.1 02/10/2023 0915    Hgb A1C Lab Results  Component Value Date   HGBA1C 5.7 (H) 07/24/2023           Assessment & Plan:     RTC in 6 months, for your annual exam Nicki Reaper, NP

## 2023-07-31 NOTE — Assessment & Plan Note (Signed)
 Encouraged weight loss as this can help reduce joint pain Continue ibuprofen as needed

## 2023-07-31 NOTE — Assessment & Plan Note (Signed)
Avoid foods that trigger your reflux Encouraged weight loss as this can help reduce reflux symptoms Continue omeprazole as needed

## 2023-07-31 NOTE — Assessment & Plan Note (Signed)
 Encouraged diet and exercise for weight loss ?

## 2023-08-04 MED ORDER — ATORVASTATIN CALCIUM 20 MG PO TABS: 20.0000 mg | ORAL_TABLET | Freq: Every day | ORAL | 1 refills | Status: DC

## 2023-10-31 LAB — COMPREHENSIVE METABOLIC PANEL WITH GFR
ALT: 16 IU/L (ref 0–32)
AST: 18 IU/L (ref 0–40)
Albumin: 4.2 g/dL (ref 3.8–4.9)
Alkaline Phosphatase: 97 IU/L (ref 44–121)
BUN/Creatinine Ratio: 14 (ref 9–23)
BUN: 11 mg/dL (ref 6–24)
Bilirubin Total: 0.5 mg/dL (ref 0.0–1.2)
CO2: 20 mmol/L (ref 20–29)
Calcium: 10 mg/dL (ref 8.7–10.2)
Chloride: 101 mmol/L (ref 96–106)
Creatinine, Ser: 0.79 mg/dL (ref 0.57–1.00)
Globulin, Total: 2.5 g/dL (ref 1.5–4.5)
Glucose: 83 mg/dL (ref 70–99)
Potassium: 4.4 mmol/L (ref 3.5–5.2)
Sodium: 139 mmol/L (ref 134–144)
Total Protein: 6.7 g/dL (ref 6.0–8.5)
eGFR: 89 mL/min/{1.73_m2} (ref >59)

## 2023-10-31 LAB — LIPID PANEL
Chol/HDL Ratio: 1.5 ratio (ref 0.0–4.4)
Cholesterol, Total: 169 mg/dL (ref 100–199)
HDL: 116 mg/dL (ref >39)
LDL Chol Calc (NIH): 23 mg/dL (ref 0–99)
Triglycerides: 208 mg/dL — ABNORMAL HIGH (ref 0–149)
VLDL Cholesterol Cal: 30 mg/dL (ref 5–40)

## 2023-11-02 ENCOUNTER — Ambulatory Visit: Payer: Self-pay | Admitting: Internal Medicine

## 2023-12-07 ENCOUNTER — Other Ambulatory Visit: Payer: Self-pay | Admitting: Obstetrics and Gynecology

## 2023-12-07 DIAGNOSIS — Z1231 Encounter for screening mammogram for malignant neoplasm of breast: Secondary | ICD-10-CM

## 2023-12-25 ENCOUNTER — Other Ambulatory Visit: Payer: Self-pay | Admitting: Internal Medicine

## 2023-12-28 NOTE — Telephone Encounter (Signed)
 Rx 08/04/23 #90 1RF Requested Prescriptions  Pending Prescriptions Disp Refills   atorvastatin  (LIPITOR) 20 MG tablet [Pharmacy Med Name: Atorvastatin  Calcium  20 MG Oral Tablet] 90 tablet 3    Sig: TAKE 1 TABLET BY MOUTH DAILY     Cardiovascular:  Antilipid - Statins Failed - 12/28/2023  4:16 PM      Failed - Lipid Panel in normal range within the last 12 months    Cholesterol, Total  Date Value Ref Range Status  10/30/2023 169 100 - 199 mg/dL Final   LDL Chol Calc (NIH)  Date Value Ref Range Status  10/30/2023 23 0 - 99 mg/dL Final   HDL  Date Value Ref Range Status  10/30/2023 116 >39 mg/dL Final   Triglycerides  Date Value Ref Range Status  10/30/2023 208 (H) 0 - 149 mg/dL Final         Passed - Patient is not pregnant      Passed - Valid encounter within last 12 months    Recent Outpatient Visits           5 months ago Essential hypertension   [facility] [place], [place], [place]

## 2023-12-29 ENCOUNTER — Ambulatory Visit
Admission: RE | Admit: 2023-12-29 | Discharge: 2023-12-29 | Disposition: A | Discharge status: Home / Self Care | Source: Ambulatory Visit | Attending: Obstetrics and Gynecology | Admitting: Obstetrics and Gynecology

## 2023-12-29 DIAGNOSIS — Z1231 Encounter for screening mammogram for malignant neoplasm of breast: Secondary | ICD-10-CM | POA: Insufficient documentation

## 2023-12-31 ENCOUNTER — Ambulatory Visit: Payer: Self-pay | Admitting: Obstetrics and Gynecology

## 2024-01-15 ENCOUNTER — Encounter: Payer: Self-pay | Admitting: Internal Medicine

## 2024-01-15 ENCOUNTER — Other Ambulatory Visit: Payer: Self-pay | Admitting: Internal Medicine

## 2024-01-18 ENCOUNTER — Other Ambulatory Visit: Payer: Self-pay

## 2024-01-18 MED ORDER — ATORVASTATIN CALCIUM 20 MG PO TABS: 20.0000 mg | ORAL_TABLET | Freq: Every day | ORAL | 1 refills | Status: DC

## 2024-01-18 NOTE — Telephone Encounter (Signed)
 Duplicate request, refilled 01/18/24.  Requested Prescriptions  Pending Prescriptions Disp Refills   atorvastatin  (LIPITOR) 20 MG tablet [Pharmacy Med Name: Atorvastatin  Calcium  20 MG Oral Tablet] 90 tablet 3    Sig: TAKE 1 TABLET BY MOUTH DAILY     Cardiovascular:  Antilipid - Statins Failed - 01/18/2024  9:31 AM      Failed - Lipid Panel in normal range within the last 12 months    Cholesterol, Total  Date Value Ref Range Status  10/30/2023 169 100 - 199 mg/dL Final   LDL Chol Calc (NIH)  Date Value Ref Range Status  10/30/2023 23 0 - 99 mg/dL Final   HDL  Date Value Ref Range Status  10/30/2023 116 >39 mg/dL Final   Triglycerides  Date Value Ref Range Status  10/30/2023 208 (H) 0 - 149 mg/dL Final         Passed - Patient is not pregnant      Passed - Valid encounter within last 12 months    Recent Outpatient Visits           5 months ago Essential hypertension   [facility] [place], [place], [place]

## 2024-02-05 ENCOUNTER — Encounter: Admitting: Internal Medicine

## 2024-03-21 ENCOUNTER — Encounter: Payer: Self-pay | Admitting: Internal Medicine

## 2024-03-21 ENCOUNTER — Ambulatory Visit (INDEPENDENT_AMBULATORY_CARE_PROVIDER_SITE_OTHER): Admitting: Internal Medicine

## 2024-03-21 VITALS — BP 122/78 | Ht 61.5 in | Wt 214.0 lb

## 2024-03-21 DIAGNOSIS — Z0001 Encounter for general adult medical examination with abnormal findings: Secondary | ICD-10-CM

## 2024-03-21 DIAGNOSIS — Z6839 Body mass index (BMI) 39.0-39.9, adult: Secondary | ICD-10-CM

## 2024-03-21 DIAGNOSIS — E66812 Obesity, class 2: Secondary | ICD-10-CM | POA: Diagnosis not present

## 2024-03-21 NOTE — Patient Instructions (Signed)

## 2024-03-21 NOTE — Assessment & Plan Note (Signed)
 Encouraged diet and exercise for weight loss ?

## 2024-03-21 NOTE — Progress Notes (Signed)
 Subjective:    Patient ID: Tina Hurley, female    DOB: 1970/07/06, 53 y.o.   MRN: [id number]  HPI  Pt presents to the clinic today for her annual exam.  Flu: never Tetanus: > 10 years ago Covid: never Shingrix: 05/2022, 06/2022 Pap smear: 01/2022 Mammogram: 12/2023 Colon screening: 02/2021 Vision screening: annually Dentist: biannually  Diet: She does eat meat. She consumes fruits and veggies. She tries to avoid fried foods. She drinks mostly water, dt. Pepsi, sweet tea. Exercise: pickleball, body weight exercise  Review of Systems     Past Medical History:  Diagnosis Date  . Allergy   . Hypertension   . Menstrual migraine     Current Outpatient Medications  Medication Sig Dispense Refill  . atorvastatin  (LIPITOR) 20 MG tablet Take 1 tablet (20 mg total) by mouth daily. 90 tablet 1  . b complex vitamins tablet Take 1 tablet by mouth daily.    . Cholecalciferol (VITAMIN D3) 10 MCG (400 UNIT) CAPS Take by mouth.    SABRA CINNAMON PO Take by mouth.    . lisinopril  (ZESTRIL ) 10 MG tablet TAKE 1 TABLET BY MOUTH DAILY 90 tablet 3  . Multiple Vitamin (MULTIVITAMIN) tablet Take 1 tablet by mouth every other day.     SABRA OMEPRAZOLE PO Take 10 mg by mouth as needed.     . Turmeric (QC TUMERIC COMPLEX PO) Take by mouth.     No current facility-administered medications for this visit.    No Known Allergies  Family History  Problem Relation Age of Onset  . Breast cancer Maternal Grandmother 60  . Colon cancer Maternal Grandmother 20  . Liver cancer Maternal Grandmother   . Hypertension Mother   . Diabetes Mother   . Hypertension Father   . Diabetes Father   . Heart disease Father   . Heart attack Father   . Obesity Father   . Hypertension Sister   . Dementia Maternal Grandfather   . Heart attack Paternal Grandfather     Social History   Socioeconomic History  . Marital status: Divorced    Spouse name: Not on file  . Number of children: Not on file  . Years of  education: Not on file  . Highest education level: Some college, no degree  Occupational History  . Not on file  Tobacco Use  . Smoking status: Never  . Smokeless tobacco: Never  Vaping Use  . Vaping status: Never Used  Substance and Sexual Activity  . Alcohol use: Yes    Comment: occassionally   . Drug use: No  . Sexual activity: Not Currently    Birth control/protection: None  Other Topics Concern  . Not on file  Social History Narrative  . Not on file   Social Drivers of Health   Financial Resource Strain: Low Risk  (03/20/2024)   Overall Financial Resource Strain (CARDIA)   . Difficulty of Paying Living Expenses: Not very hard  Food Insecurity: No Food Insecurity (03/20/2024)   Hunger Vital Sign   . Worried About Programme Researcher, Broadcasting/film/video in the Last Year: Never true   . Ran Out of Food in the Last Year: Never true  Transportation Needs: No Transportation Needs (03/20/2024)   PRAPARE - Transportation   . Lack of Transportation (Medical): No   . Lack of Transportation (Non-Medical): No  Physical Activity: Insufficiently Active (03/20/2024)   Exercise Vital Sign   . Days of Exercise per Week: 5 days   . Minutes  of Exercise per Session: 20 min  Stress: No Stress Concern Present (03/20/2024)   [employer] of Occupational Health - Occupational Stress Questionnaire   . Feeling of Stress: Not at all  Social Connections: Socially Isolated (03/20/2024)   Social Connection and Isolation Panel   . Frequency of Communication with Friends and Family: More than three times a week   . Frequency of Social Gatherings with Friends and Family: Twice a week   . Attends Religious Services: Patient declined   . Active Member of Clubs or Organizations: No   . Attends Banker Meetings: Not on file   . Marital Status: Divorced  Catering Manager Violence: Not At Risk (02/05/2018)   Humiliation, Afraid, Rape, and Kick questionnaire   . Fear of Current or Ex-Partner: No   .  Emotionally Abused: No   . Physically Abused: No   . Sexually Abused: No     Constitutional: Denies fever, malaise, fatigue, headache or abrupt weight changes.  HEENT: Denies eye pain, eye redness, ear pain, ringing in the ears, wax buildup, runny nose, nasal congestion, bloody nose, or sore throat. Respiratory: Denies difficulty breathing, shortness of breath, cough or sputum production.   Cardiovascular: Denies chest pain, chest tightness, palpitations or swelling in the hands or feet.  Gastrointestinal: Pt reports intermittent reflux. Denies abdominal pain, bloating, constipation, diarrhea or blood in the stool.  GU: Denies urgency, frequency, pain with urination, burning sensation, blood in urine, odor or discharge. Musculoskeletal: Pt reports intermittent knee pain. Denies decrease in range of motion, difficulty with gait, muscle pain or joint swelling.  Skin: Denies redness, rashes, lesions or ulcercations.  Neurological: Denies dizziness, difficulty with memory, difficulty with speech or problems with balance and coordination.  Psych: Denies anxiety, depression, SI/HI.  No other specific complaints in a complete review of systems (except as listed in HPI above).  Objective:   Physical Exam BP 122/78 (BP Location: Left Arm, Patient Position: Sitting, Cuff Size: Large)   Ht 5' 1.5 (1.562 m)   Wt 214 lb (97.1 kg)   BMI 39.78 kg/m    Wt Readings from Last 3 Encounters:  07/31/23 209 lb 3.2 oz (94.9 kg)  02/10/23 214 lb (97.1 kg)  01/30/23 214 lb (97.1 kg)    General: Appears her stated age, obese, in NAD. Skin: Warm, dry and intact.  HEENT: Head: normal shape and size; Eyes: sclera white, no icterus, conjunctiva pink, PERRLA and EOMs intact;  Neck:  Neck supple, trachea midline. No masses, lumps or thyromegaly present.  Cardiovascular: Normal rate and rhythm. S1,S2 noted.  No murmur, rubs or gallops noted. No JVD or BLE edema. No carotid bruits noted. Pulmonary/Chest:  Normal effort and positive vesicular breath sounds. No respiratory distress. No wheezes, rales or ronchi noted.  Abdomen: Normal bowel sounds.  Musculoskeletal: Strength 5/5 BUE/BLE. No difficulty with gait.  Neurological: Alert and oriented. Cranial nerves II-XII grossly intact. Coordination normal.  Psychiatric: Mood and affect normal. Behavior is normal. Judgment and thought content normal.    BMET    Component Value Date/Time   NA 139 10/30/2023 1121   K 4.4 10/30/2023 1121   CL 101 10/30/2023 1121   CO2 20 10/30/2023 1121   GLUCOSE 83 10/30/2023 1121   BUN 11 10/30/2023 1121   CREATININE 0.79 10/30/2023 1121   CALCIUM  10.0 10/30/2023 1121   GFRNONAA 87 09/16/2019 1155   GFRAA 100 09/16/2019 1155    Lipid Panel     Component Value Date/Time   CHOL  169 10/30/2023 1121   TRIG 208 (H) 10/30/2023 1121   HDL 116 10/30/2023 1121   CHOLHDL 1.5 10/30/2023 1121   LDLCALC 23 10/30/2023 1121    CBC    Component Value Date/Time   WBC 9.0 07/24/2023 0958   RBC 4.70 07/24/2023 0958   HGB 13.7 07/24/2023 0958   HCT 41.0 07/24/2023 0958   PLT 364 07/24/2023 0958   MCV 87 07/24/2023 0958   MCH 29.1 07/24/2023 0958   MCHC 33.4 07/24/2023 0958   RDW 12.4 07/24/2023 0958   LYMPHSABS 3.9 (H) 02/10/2023 0915   EOSABS 0.1 02/10/2023 0915   BASOSABS 0.1 02/10/2023 0915    Hgb A1C Lab Results  Component Value Date   HGBA1C 5.7 (H) 07/24/2023             Assessment & Plan:   Preventative health maintenance:  Flu shot declined Tetanus declined Encouraged her to get her COVID-vaccine Shingrix vaccine UTD Pap smear UTD Mammogram UTD Colon screening UTD Encouraged her to consume a balanced diet and exercise regimen Advised her to see an eye doctor and dentist annually She will send me labs from her recent biometric screening  RTC in 6 months, follow-up chronic conditions [place] Laura, NP

## 2024-03-22 ENCOUNTER — Encounter: Payer: Self-pay | Admitting: Internal Medicine

## 2024-04-17 NOTE — Progress Notes (Unsigned)
 No chief complaint on file.    HPI:      [name] is a 53 y.o. G1P1001 who LMP was No LMP recorded. Patient is perimenopausal., presents today for her annual examination. Her menses are now slightly irregular, lasting 5-6 days, mod flow, but has skipped a few months occas. Dysmenorrhea none. No BTB, no vasomotor sx. Was on OCPs for cycle control but had to stop due to dx of HTN. Tried POPs but cycles irregular, so pt stopped them. Pt with AUB (BTB and menorrhagia) a few months this past spring.  Gyn u/s 5/24 showed the Endometrium measures 39.98 mm. Extremely thickened and heterogenous. NEG EMB bx 6/24; has not had sx since. Last periods were light or mod flow; rare BTB. Declines IUD, may be interested in different POP.    Sex activity: not sexually active. No vag sx.  Last Pap: 02/10/23 Results were: ASCUS /neg HPV DNA.  Pt likes yearly paps, works at [employer].  Hx of STDs: none   Last mammogram: 12/29/23 Results were: normal--routine follow-up in 12 months There is a FH of breast cancer in her MGM and mat grt aunt, genetic testing not indicated. There is no FH of ovarian cancer. The patient does do self-breast exams.   Tobacco use: The patient denies current or previous tobacco use. Alcohol use: none No drug use Exercise: mod active, plays pickleball  Colonoscopy: 10/22 with [facility]; repeat due 10 yrs   She does get adequate calcium  and Vitamin D in her diet.   Labs with PCP/work.   Past Medical History:  Diagnosis Date   Allergy    Arthritis    Hypertension    Menstrual migraine     Past Surgical History:  Procedure Laterality Date   COLONOSCOPY WITH PROPOFOL  N/A 03/01/2021   Procedure: COLONOSCOPY WITH PROPOFOL ;  Surgeon: Therisa Bi, MD;  Location: [facility] ENDOSCOPY;  Service: Gastroenterology;  Laterality: N/A;   MOUTH SURGERY     OTHER SURGICAL HISTORY  03/2021   miniscus surgery   WISDOM TOOTH EXTRACTION      Family History  Problem Relation Age of Onset    Breast cancer Maternal Grandmother 60   Colon cancer Maternal Grandmother 62   Liver cancer Maternal Grandmother    Cancer Maternal Grandmother    Hypertension Mother    Diabetes Mother    Arthritis Mother    Depression Mother    Hypertension Father    Diabetes Father    Heart disease Father    Heart attack Father    Obesity Father    Hypertension Sister    Dementia Maternal Grandfather    Heart attack Paternal Grandfather     Social History   Socioeconomic History   Marital status: Divorced    Spouse name: Not on file   Number of children: Not on file   Years of education: Not on file   Highest education level: Some college, no degree  Occupational History   Not on file  Tobacco Use   Smoking status: Never   Smokeless tobacco: Never  Vaping Use   Vaping status: Never Used  Substance and Sexual Activity   Alcohol use: Yes    Comment: occassionally    Drug use: No   Sexual activity: Not Currently    Birth control/protection: Abstinence, None  Other Topics Concern   Not on file  Social History Narrative   Not on file   Social Drivers of Health   Financial Resource Strain:  Low Risk  (03/20/2024)   Overall Financial Resource Strain (CARDIA)    Difficulty of Paying Living Expenses: Not very hard  Food Insecurity: No Food Insecurity (03/20/2024)   Hunger Vital Sign    Worried About Running Out of Food in the Last Year: Never true    Ran Out of Food in the Last Year: Never true  Transportation Needs: No Transportation Needs (03/20/2024)   PRAPARE - Administrator, Civil Service (Medical): No    Lack of Transportation (Non-Medical): No  Physical Activity: Insufficiently Active (03/20/2024)   Exercise Vital Sign    Days of Exercise per Week: 5 days    Minutes of Exercise per Session: 20 min  Stress: No Stress Concern Present (03/20/2024)   [employer] of Occupational Health - Occupational Stress Questionnaire    Feeling of Stress: Not at all   Social Connections: Socially Isolated (03/20/2024)   Social Connection and Isolation Panel    Frequency of Communication with Friends and Family: More than three times a week    Frequency of Social Gatherings with Friends and Family: Twice a week    Attends Religious Services: Patient declined    Database Administrator or Organizations: No    Attends Engineer, Structural: Not on file    Marital Status: Divorced  Intimate Partner Violence: Not At Risk (02/05/2018)   Humiliation, Afraid, Rape, and Kick questionnaire    Fear of Current or Ex-Partner: No    Emotionally Abused: No    Physically Abused: No    Sexually Abused: No    Current Outpatient Medications on File Prior to Visit  Medication Sig Dispense Refill   atorvastatin  (LIPITOR) 20 MG tablet Take 1 tablet (20 mg total) by mouth daily. 90 tablet 1   b complex vitamins tablet Take 1 tablet by mouth daily.     CINNAMON PO Take by mouth.     Cyanocobalamin (B-12 PO) Take by mouth.     lisinopril  (ZESTRIL ) 10 MG tablet TAKE 1 TABLET BY MOUTH DAILY 90 tablet 3   Multiple Vitamin (MULTIVITAMIN) tablet Take 1 tablet by mouth every other day.      OMEPRAZOLE PO Take 10 mg by mouth as needed.      No current facility-administered medications on file prior to visit.    ROS:  Review of Systems  Constitutional:  Negative for fatigue, fever and unexpected weight change.  Respiratory:  Negative for cough, shortness of breath and wheezing.   Cardiovascular:  Negative for chest pain, palpitations and leg swelling.  Gastrointestinal:  Negative for blood in stool, constipation, diarrhea, nausea and vomiting.  Endocrine: Negative for cold intolerance, heat intolerance and polyuria.  Genitourinary:  Negative for dyspareunia, dysuria, flank pain, frequency, genital sores, hematuria, menstrual problem, pelvic pain, urgency, vaginal bleeding, vaginal discharge and vaginal pain.  Musculoskeletal:  Negative for arthralgias, back pain,  joint swelling and myalgias.  Skin:  Negative for rash.  Neurological:  Negative for dizziness, syncope, light-headedness, numbness and headaches.  Hematological:  Negative for adenopathy.  Psychiatric/Behavioral:  Negative for agitation, confusion, sleep disturbance and suicidal ideas. The patient is not nervous/anxious.      Objective: There were no vitals taken for this visit.   Physical Exam Constitutional:      Appearance: She is well-developed.  Genitourinary:     Vulva normal.     Right Labia: No rash, tenderness or lesions.    Left Labia: No tenderness, lesions or rash.    No  vaginal discharge, erythema or tenderness.      Right Adnexa: not tender and no mass present.    Left Adnexa: not tender and no mass present.    No cervical motion tenderness, friability or polyp.     Uterus is not enlarged or tender.  Breasts:    Right: No mass, nipple discharge, skin change or tenderness.     Left: No mass, nipple discharge, skin change or tenderness.  Neck:     Thyroid: No thyromegaly.  Cardiovascular:     Rate and Rhythm: Normal rate and regular rhythm.     Heart sounds: Normal heart sounds. No murmur heard. Pulmonary:     Effort: Pulmonary effort is normal.     Breath sounds: Normal breath sounds.  Abdominal:     Palpations: Abdomen is soft.     Tenderness: There is no abdominal tenderness. There is no guarding or rebound.  Musculoskeletal:        General: Normal range of motion.     Cervical back: Normal range of motion.  Lymphadenopathy:     Cervical: No cervical adenopathy.  Neurological:     General: No focal deficit present.     Mental Status: She is alert and oriented to person, place, and time.     Cranial Nerves: No cranial nerve deficit.  Skin:    General: Skin is warm and dry.  Psychiatric:        Mood and Affect: Mood normal.        Behavior: Behavior normal.        Thought Content: Thought content normal.        Judgment: Judgment normal.  Vitals  reviewed.     Assessment/Plan: Encounter for annual routine gynecological examination  Cervical cancer screening - Plan: IGP, Aptima HPV  Screening for HPV (human papillomavirus) - Plan: IGP, Aptima HPV  Encounter for screening mammogram for malignant neoplasm of breast; pt current on mammo  Abnormal uterine bleeding (AUB)--f/u prn AUB. F/u if wants to try aygestin /slynd.             GYN counsel breast self exam, mammography screening, adequate intake of calcium  and vitamin D, diet and exercise     F/U  No follow-ups on file.  [place] Nordquist B. Priyal Musquiz, PA-C 04/17/2024 7:19 PM

## 2024-04-18 ENCOUNTER — Encounter: Payer: Self-pay | Admitting: Obstetrics and Gynecology

## 2024-04-18 ENCOUNTER — Ambulatory Visit: Admitting: Obstetrics and Gynecology

## 2024-04-18 VITALS — BP 132/78 | HR 76 | Ht 61.5 in | Wt 213.0 lb

## 2024-04-18 DIAGNOSIS — R87610 Atypical squamous cells of undetermined significance on cytologic smear of cervix (ASC-US): Secondary | ICD-10-CM

## 2024-04-18 DIAGNOSIS — N939 Abnormal uterine and vaginal bleeding, unspecified: Secondary | ICD-10-CM

## 2024-04-18 DIAGNOSIS — Z1151 Encounter for screening for human papillomavirus (HPV): Secondary | ICD-10-CM

## 2024-04-18 DIAGNOSIS — Z1231 Encounter for screening mammogram for malignant neoplasm of breast: Secondary | ICD-10-CM

## 2024-04-18 DIAGNOSIS — Z01419 Encounter for gynecological examination (general) (routine) without abnormal findings: Secondary | ICD-10-CM

## 2024-04-18 DIAGNOSIS — Z124 Encounter for screening for malignant neoplasm of cervix: Secondary | ICD-10-CM

## 2024-04-18 NOTE — Patient Instructions (Signed)
 I value your feedback and you entrusting [place] with your care. If you get a [place] patient survey, I would appreciate you taking the time to let us know about your experience today. Thank you! ? ? ?

## 2024-04-21 LAB — IGP, APTIMA HPV: HPV Aptima: NEGATIVE

## 2024-06-12 ENCOUNTER — Other Ambulatory Visit: Payer: Self-pay | Admitting: Internal Medicine

## 2024-06-12 DIAGNOSIS — I10 Essential (primary) hypertension: Secondary | ICD-10-CM

## 2024-06-14 NOTE — Telephone Encounter (Signed)
 Requested Prescriptions  Pending Prescriptions Disp Refills   lisinopril  (ZESTRIL ) 10 MG tablet [Pharmacy Med Name: Lisinopril  10 MG Oral Tablet] 90 tablet 3    Sig: TAKE 1 TABLET BY MOUTH DAILY     Cardiovascular:  ACE Inhibitors Failed - 06/14/2024 11:51 AM      Failed - Cr in normal range and within 180 days    Creatinine, Ser  Date Value Ref Range Status  10/30/2023 0.79 0.57 - 1.00 mg/dL Final         Failed - K in normal range and within 180 days    Potassium  Date Value Ref Range Status  10/30/2023 4.4 3.5 - 5.2 mmol/L Final         Passed - Patient is not pregnant      Passed - Last BP in normal range    BP Readings from Last 1 Encounters:  04/18/24 132/78         Passed - Valid encounter within last 6 months    Recent Outpatient Visits           2 months ago Encounter for general adult medical examination with abnormal findings   [place] [facility] [place], Angeline ORN, NP   10 months ago Essential hypertension   [place] [facility] [place], Angeline ORN, NP               atorvastatin  (LIPITOR) 20 MG tablet [Pharmacy Med Name: Atorvastatin  Calcium  20 MG Oral Tablet] 90 tablet 1    Sig: TAKE 1 TABLET BY MOUTH DAILY     Cardiovascular:  Antilipid - Statins Failed - 06/14/2024 11:51 AM      Failed - Lipid Panel in normal range within the last 12 months    Cholesterol, Total  Date Value Ref Range Status  10/30/2023 169 100 - 199 mg/dL Final   LDL Chol Calc (NIH)  Date Value Ref Range Status  10/30/2023 23 0 - 99 mg/dL Final   HDL  Date Value Ref Range Status  10/30/2023 116 >39 mg/dL Final   Triglycerides  Date Value Ref Range Status  10/30/2023 208 (H) 0 - 149 mg/dL Final         Passed - Patient is not pregnant      Passed - Valid encounter within last 12 months    Recent Outpatient Visits           2 months ago Encounter for general adult medical examination with abnormal findings   [place] [facility] [place], Angeline ORN, NP   10 months ago Essential hypertension   [place] [facility] [place], Angeline ORN, [place]

## 2024-06-14 NOTE — Telephone Encounter (Signed)
 Requested medications are due for refill today.  yes  Requested medications are on the active medications list.  yes  Last refill. 07/30/2023 #90 3 rf  Future visit scheduled.   yes  Notes to clinic.  Expired labs.    Requested Prescriptions  Pending Prescriptions Disp Refills   lisinopril  (ZESTRIL ) 10 MG tablet [Pharmacy Med Name: Lisinopril  10 MG Oral Tablet] 90 tablet 3    Sig: TAKE 1 TABLET BY MOUTH DAILY     Cardiovascular:  ACE Inhibitors Failed - 06/14/2024 11:51 AM      Failed - Cr in normal range and within 180 days    Creatinine, Ser  Date Value Ref Range Status  10/30/2023 0.79 0.57 - 1.00 mg/dL Final         Failed - K in normal range and within 180 days    Potassium  Date Value Ref Range Status  10/30/2023 4.4 3.5 - 5.2 mmol/L Final         Passed - Patient is not pregnant      Passed - Last BP in normal range    BP Readings from Last 1 Encounters:  04/18/24 132/78         Passed - Valid encounter within last 6 months    Recent Outpatient Visits           2 months ago Encounter for general adult medical examination with abnormal findings   [place] [facility] [place], Angeline ORN, NP   10 months ago Essential hypertension   [place] [facility] [place], Angeline ORN, NP              Signed Prescriptions Disp Refills   atorvastatin  (LIPITOR) 20 MG tablet 90 tablet 1    Sig: TAKE 1 TABLET BY MOUTH DAILY     Cardiovascular:  Antilipid - Statins Failed - 06/14/2024 11:51 AM      Failed - Lipid Panel in normal range within the last 12 months    Cholesterol, Total  Date Value Ref Range Status  10/30/2023 169 100 - 199 mg/dL Final   LDL Chol Calc (NIH)  Date Value Ref Range Status  10/30/2023 23 0 - 99 mg/dL Final   HDL  Date Value Ref Range Status  10/30/2023 116 >39 mg/dL Final   Triglycerides  Date Value Ref Range Status  10/30/2023 208 (H) 0 - 149 mg/dL Final         Passed - Patient is not pregnant       Passed - Valid encounter within last 12 months    Recent Outpatient Visits           2 months ago Encounter for general adult medical examination with abnormal findings   [place] [facility] [place], Angeline ORN, NP   10 months ago Essential hypertension    [facility] [place], Angeline ORN, [place]

## 2024-10-07 ENCOUNTER — Ambulatory Visit: Admitting: Internal Medicine
